# Patient Record
Sex: Female | Born: 1950
Health system: Southern US, Community
[De-identification: ages and names within clinical notes are randomized; demographics above are authoritative.]

## PROBLEM LIST (undated history)

## (undated) DIAGNOSIS — C801 Malignant (primary) neoplasm, unspecified: Secondary | ICD-10-CM

## (undated) DIAGNOSIS — K635 Polyp of colon: Secondary | ICD-10-CM

## (undated) DIAGNOSIS — M810 Age-related osteoporosis without current pathological fracture: Secondary | ICD-10-CM

## (undated) HISTORY — PX: POLYPECTOMY: SHX149

## (undated) HISTORY — DX: Age-related osteoporosis without current pathological fracture: M81.0

## (undated) HISTORY — DX: Malignant (primary) neoplasm, unspecified: C80.1

## (undated) HISTORY — DX: Polyp of colon: K63.5

## (undated) HISTORY — PX: COLONOSCOPY: SHX174

---

## 1999-07-11 ENCOUNTER — Encounter (INDEPENDENT_AMBULATORY_CARE_PROVIDER_SITE_OTHER): Payer: Self-pay | Admitting: Specialist

## 1999-07-11 ENCOUNTER — Other Ambulatory Visit: Admission: RE | Admit: 1999-07-11 | Discharge: 1999-07-11 | Payer: Self-pay | Admitting: Obstetrics and Gynecology

## 2005-06-28 ENCOUNTER — Ambulatory Visit: Payer: Self-pay | Admitting: Internal Medicine

## 2006-07-09 ENCOUNTER — Ambulatory Visit: Payer: Self-pay | Admitting: Internal Medicine

## 2006-07-18 ENCOUNTER — Ambulatory Visit: Payer: Self-pay | Admitting: Internal Medicine

## 2006-08-07 ENCOUNTER — Encounter (INDEPENDENT_AMBULATORY_CARE_PROVIDER_SITE_OTHER): Payer: Self-pay | Admitting: Specialist

## 2006-08-07 ENCOUNTER — Ambulatory Visit: Payer: Self-pay | Admitting: Internal Medicine

## 2008-05-06 ENCOUNTER — Ambulatory Visit: Payer: Self-pay | Admitting: Internal Medicine

## 2008-05-06 DIAGNOSIS — E7800 Pure hypercholesterolemia, unspecified: Secondary | ICD-10-CM | POA: Insufficient documentation

## 2008-05-06 DIAGNOSIS — M1990 Unspecified osteoarthritis, unspecified site: Secondary | ICD-10-CM | POA: Insufficient documentation

## 2009-06-13 ENCOUNTER — Encounter (INDEPENDENT_AMBULATORY_CARE_PROVIDER_SITE_OTHER): Payer: Self-pay | Admitting: *Deleted

## 2010-01-19 ENCOUNTER — Telehealth (INDEPENDENT_AMBULATORY_CARE_PROVIDER_SITE_OTHER): Payer: Self-pay | Admitting: *Deleted

## 2010-05-07 ENCOUNTER — Encounter (INDEPENDENT_AMBULATORY_CARE_PROVIDER_SITE_OTHER): Payer: Self-pay | Admitting: *Deleted

## 2010-05-09 ENCOUNTER — Encounter: Payer: Self-pay | Admitting: Internal Medicine

## 2010-05-28 ENCOUNTER — Encounter (INDEPENDENT_AMBULATORY_CARE_PROVIDER_SITE_OTHER): Payer: Self-pay | Admitting: *Deleted

## 2010-06-01 ENCOUNTER — Ambulatory Visit: Payer: Self-pay | Admitting: Internal Medicine

## 2010-06-14 ENCOUNTER — Ambulatory Visit: Payer: Self-pay | Admitting: Internal Medicine

## 2010-08-22 ENCOUNTER — Ambulatory Visit: Payer: Self-pay | Admitting: Internal Medicine

## 2010-08-22 ENCOUNTER — Encounter: Payer: Self-pay | Admitting: Internal Medicine

## 2010-08-22 LAB — CONVERTED CEMR LAB
ALT: 13 units/L (ref 0–35)
AST: 23 units/L (ref 0–37)
Alkaline Phosphatase: 59 units/L (ref 39–117)
Basophils Relative: 1.2 % (ref 0.0–3.0)
Bilirubin Urine: NEGATIVE
Bilirubin, Direct: 0.1 mg/dL (ref 0.0–0.3)
Calcium: 9.2 mg/dL (ref 8.4–10.5)
Cholesterol: 208 mg/dL — ABNORMAL HIGH (ref 0–200)
Creatinine, Ser: 0.8 mg/dL (ref 0.4–1.2)
Eosinophils Absolute: 0.1 10*3/uL (ref 0.0–0.7)
Eosinophils Relative: 2.5 % (ref 0.0–5.0)
GFR calc non Af Amer: 75.36 mL/min (ref >60.00)
Hemoglobin: 12.6 g/dL (ref 12.0–15.0)
Lymphocytes Relative: 36.2 % (ref 12.0–46.0)
Monocytes Relative: 7.8 % (ref 3.0–12.0)
Neutro Abs: 2 10*3/uL (ref 1.4–7.7)
Neutrophils Relative %: 52.3 % (ref 43.0–77.0)
Nitrite: NEGATIVE
RBC: 3.76 M/uL — ABNORMAL LOW (ref 3.87–5.11)
Sodium: 143 meq/L (ref 135–145)
Total CHOL/HDL Ratio: 3
Total Protein, Urine: NEGATIVE mg/dL
Total Protein: 6.5 g/dL (ref 6.0–8.3)
Triglycerides: 37 mg/dL (ref 0.0–149.0)
Urine Glucose: NEGATIVE mg/dL
VLDL: 7.4 mg/dL (ref 0.0–40.0)
WBC: 3.8 10*3/uL — ABNORMAL LOW (ref 4.5–10.5)
pH: 6 (ref 5.0–8.0)

## 2010-09-13 ENCOUNTER — Telehealth (INDEPENDENT_AMBULATORY_CARE_PROVIDER_SITE_OTHER): Payer: Self-pay | Admitting: *Deleted

## 2010-09-18 ENCOUNTER — Telehealth (INDEPENDENT_AMBULATORY_CARE_PROVIDER_SITE_OTHER): Payer: Self-pay | Admitting: *Deleted

## 2010-10-07 LAB — CONVERTED CEMR LAB
ALT: 15 units/L (ref 0–35)
Albumin: 4.2 g/dL (ref 3.5–5.2)
Alkaline Phosphatase: 46 units/L (ref 39–117)
BUN: 12 mg/dL (ref 6–23)
Bilirubin, Direct: 0.2 mg/dL (ref 0.0–0.3)
CO2: 29 meq/L (ref 19–32)
CRP, High Sensitivity: <1 — ABNORMAL LOW (ref 0.00–5.00)
Calcium: 9.3 mg/dL (ref 8.4–10.5)
Eosinophils Relative: 2.3 % (ref 0.0–5.0)
GFR calc Af Amer: 73 mL/min
Glucose, Bld: 93 mg/dL (ref 70–99)
HCT: 40.8 % (ref 36.0–46.0)
Hemoglobin: 14 g/dL (ref 12.0–15.0)
Lymphocytes Relative: 34.5 % (ref 12.0–46.0)
Monocytes Absolute: 0.3 10*3/uL (ref 0.1–1.0)
Monocytes Relative: 6.3 % (ref 3.0–12.0)
Neutro Abs: 3.1 10*3/uL (ref 1.4–7.7)
RBC: 4.24 M/uL (ref 3.87–5.11)
Sodium: 143 meq/L (ref 135–145)
TSH: 2.8 microintl units/mL (ref 0.35–5.50)
Total CHOL/HDL Ratio: 2.6
Total Protein: 7.2 g/dL (ref 6.0–8.3)
VLDL: 5 mg/dL (ref 0–40)
WBC: 5.5 10*3/uL (ref 4.5–10.5)

## 2010-10-11 NOTE — Miscellaneous (Signed)
Summary: Lec previsit  Clinical Lists Changes  Medications: Added new medication of MOVIPREP 100 GM  SOLR (PEG-KCL-NACL-NASULF-NA ASC-C) As per prep instructions. - Signed Rx of MOVIPREP 100 GM  SOLR (PEG-KCL-NACL-NASULF-NA ASC-C) As per prep instructions.;  #1 x 0;  Signed;  Entered by: Karl Bales RN;  Authorized by: Iva Boop MD, FACG;  Method used: Electronically to [facility] [address], [place], [place]  [address], Ph: [phone] or [phone], Fax: [phone] Observations: Added new observation of NKA: T (06/01/2010 10:17)    Prescriptions: MOVIPREP 100 GM  SOLR (PEG-KCL-NACL-NASULF-NA ASC-C) As per prep instructions.  #1 x 0   Entered by:   Karl Bales RN   Authorized by:   Iva Boop MD, [facility]   Signed by:   Karl Bales RN on 06/01/2010   Method used:   Electronically to        [facility] (retail)       [address]       [place], [place]  [address]       Ph: [phone] or [phone]       Fax: [phone]   RxID:   [id number]

## 2010-10-11 NOTE — Procedures (Signed)
Summary: Colonoscopy  Patient: Catherine [name] Note: All result statuses are Final unless otherwise noted.  Tests: (1) Colonoscopy (COL)   COL Colonoscopy           DONE     [facility]     [address]     [place], [place]  [address]           COLONOSCOPY PROCEDURE REPORT           PATIENT:  Catherine [name], Catherine [name]  MR#:  [id number]     BIRTHDATE:  [birth year], 59 yrs. old  GENDER:  female     ENDOSCOPIST:  Iva Boop, MD, [facility]           PROCEDURE DATE:  06/14/2010     PROCEDURE:  Colonoscopy [address]     ASA CLASS:  Class I     INDICATIONS:  surveillance and high-risk screening, history of     pre-cancerous (adenomatous) colon polyps 7 mm tubulovillous     adenoma removed 08/07/06     MEDICATIONS:   Fentanyl 50 mcg IV, Versed 6 mg IV           DESCRIPTION OF PROCEDURE:   After the risks benefits and     alternatives of the procedure were thoroughly explained, informed     consent was obtained.  Digital rectal exam was performed and     revealed no abnormalities.   The LB [id number]-[id number] endoscope     was introduced through the anus and advanced to the cecum, which     was identified by both the appendix and ileocecal valve, without     limitations.  The quality of the prep was excellent, using     MoviPrep.  The instrument was then slowly withdrawn as the colon     was fully examined. Insertion: 6:33 minutes withdrawal: 7:17     minutes     <<PROCEDUREIMAGES>>           FINDINGS:  A normal appearing cecum, ileocecal valve, and     appendiceal orifice were identified. The ascending, hepatic     flexure, transverse, splenic flexure, descending, sigmoid colon,     and rectum appeared unremarkable.   Retroflexed views in the     rectum revealed no abnormalities.    The scope was then withdrawn     from the patient and the procedure completed.           COMPLICATIONS:  None     ENDOSCOPIC IMPRESSION:     1) Normal colonoscopy     2) prior tubulovillous adenoma (7mm)  removal 2007           REPEAT EXAM:  In 5 year(s) for routine screening colonoscopy.           Iva Boop, MD, [facility]           CC:  The Patient           n.     eSIGNED:   Iva Boop at 06/14/2010 02:32 PM           Miles Costain, [phone]  Note: An exclamation mark (!) indicates a result that was not dispersed into the flowsheet. Document Creation Date: 06/14/2010 2:33 PM _______________________________________________________________________  (1) Order result status: Final Collection or observation date-time: 06/14/2010 14:23 Requested date-time:  Receipt date-time:  Reported date-time:  Referring Physician:   Ordering Physician: Stan Head [phone]) Specimen Source:  Source: Launa Grill Order Number: [id number] Lab  site:   Appended Document: Colonoscopy    Clinical Lists Changes  Observations: Added new observation of COLONNXTDUE: 06/2015 (06/14/2010 15:17)

## 2010-10-11 NOTE — Progress Notes (Signed)
Summary: Schedule recall colonoscopy  Phone Note Outgoing Call Call back at Home Phone [phone]   Call placed by: Christie Nottingham CMA Duncan Dull),  Jan 19, 2010 10:32 AM Call placed to: Patient Summary of Call: Called pt to schedule recall colonoscopy and pt states she would like to look at her calender and call us back to schedule the procedure. She states she was in the process of moving last year and now has gotten everything settled and is now ready to schedule. She will call us back. Initial call taken by: Christie Nottingham CMA Duncan Dull),  Jan 19, 2010 10:33 AM

## 2010-10-11 NOTE — Letter (Signed)
Summary: Previsit letter  [facility]  [address] [place], [place] [address]   Phone: [phone]  Fax: [phone]       05/07/2010 MRN: [id number]  [facility] [address] [place], [place]  [address]  Dear Ms. Bogacki,  Welcome to the Gastroenterology Division at [facility].    You are scheduled to see a nurse for your pre-procedure visit on 05/29/2010 at 8:30AM on the 3rd floor at [facility], [address]  We ask that you try to arrive at our office 15 minutes prior to your appointment time to allow for check-in.  Your nurse visit will consist of discussing your medical and surgical history, your immediate family medical history, and your medications.    Please bring a complete list of all your medications or, if you prefer, bring the medication bottles and we will list them.  We will need to be aware of both prescribed and over the counter drugs.  We will need to know exact dosage information as well.  If you are on blood thinners (Coumadin, Plavix, Aggrenox, Ticlid, etc.) please call our office today/prior to your appointment, as we need to consult with your physician about holding your medication.   Please be prepared to read and sign documents such as consent forms, a financial agreement, and acknowledgement forms.  If necessary, and with your consent, a friend or relative is welcome to sit-in on the nurse visit with you.  Please bring your insurance card so that we may make a copy of it.  If your insurance requires a referral to see a specialist, please bring your referral form from your primary care physician.  No co-pay is required for this nurse visit.     If you cannot keep your appointment, please call [phone] to cancel or reschedule prior to your appointment date.  This allows [place] the opportunity to schedule an appointment for another patient in need of care.    Thank you for choosing [facility] for your medical needs.   We appreciate the opportunity to care for you.  Please visit [place] at our website  to learn more about our practice.                     Sincerely.                                                                                                                   The Gastroenterology Division

## 2010-10-11 NOTE — Progress Notes (Signed)
Summary: lab results  Phone Note Call from Patient Call back at Home Phone [phone]   Caller: Patient Call For: wert Reason for Call: Talk to Nurse, Lab or Test Results Summary of Call: Patient requesting lab results. Initial call taken by: Lehman Prom,  September 13, 2010 12:45 PM  Follow-up for Phone Call        Spoke with pt and notified vitamin d normal no changes in therapy needed. Follow-up by: Vernie Murders,  September 13, 2010 3:39 PM

## 2010-10-11 NOTE — Assessment & Plan Note (Signed)
Summary: Primary svc/ cpx    Primary Provider/Referring Provider:  Sherene Sires   History of Present Illness: 60  yowf never smoker   osteopenia dr Elana Alm plus vit d mamos and bone scans and pap smear  August 22, 2010 cpx no complaints. Pt denies any significant sore throat, dysphagia, itching, sneezing,  nasal congestion or excess secretions,  fever, chills, sweats, unintended wt loss, pleuritic or exertional cp, hempoptysis, change in activity tolerance  orthopnea pnd or leg swelling   Current Medications (verified): 1)  Actonel 35 Mg Tabs (Risedronate Sodium) .... Once Weekly 2)  Caltrate 600 1500 Mg Tabs (Calcium Carbonate) .Marland Kitchen.. 1 Two Times A Day 3)  D 1000 Plus  Tabs (Fa-Cyanocobalamin-B6-D-Ca) .... Once Daily 4)  Fish Oil 1000 Mg Caps (Omega-3 Fatty Acids) .Marland Kitchen.. 1 Once Daily 5)  Multivitamins  Tabs (Multiple Vitamin) .Marland Kitchen.. 1 Once Daily  Allergies (verified): No Known Drug Allergies  Past History:  Past Medical History: Osteoporosis   - fu McPhail,  restart weekly actonel August 22, 2010  Health Maint   - Td 2/06   - CPX  August 22, 2010  Colon Polyps   - colonoscopy 08/07/06 > tubulovillous   - Repeat colonscopy 10/ 6/11 neg  Family History: Hypothryoid mother Lung cancer father, smoker  siblings all younger  Social History: never smoker ETOH 2-3 x per wk Retired  Vital Signs:  Patient profile:   60 year old female Height:      67 inches Weight:      134.50 pounds BMI:     21.14 O2 Sat:      98 % on Room air Temp:     98.0 degrees F oral Pulse rate:   58 / minute BP sitting:   94 / 60  (left arm)  Vitals Entered By: Vernie Murders (August 22, 2010 8:47 AM)  O2 Flow:  Room air  Physical Exam  Additional Exam:  Ambulatory healthy appearing rel thin wf in no acute distress. wt 131 May 06 2008 > 134 August 22, 2010  HEENT: nl dentition, turbinates, and orophanx. Nl external ear canals without cough reflex Neck without JVD/Nodes/TM Lungs clear to A  and P bilaterally without cough on insp or exp maneuvers RRR no s3 or murmur or increase in P2 Abd soft and benign with nl excursion in the supine position. No bruits or organomegaly Ext warm without calf tenderness, cyanosis clubbing or edema Skin warm and dry without lesions   Neuro alert, approp, nl sensorium, no motor def.      Cholesterol          [H]  208 mg/dL                   6-301     ATP III Classification            Desirable:  < 200 mg/dL                    Borderline High:  200 - 239 mg/dL               High:  > = 240 mg/dL   Triglycerides             37.0 mg/dL                  6.0-109.3     Normal:  <150 mg/dL     Borderline High:  235 - 199 mg/dL   HDL  72.80 mg/dL                 >19.14   VLDL Cholesterol          7.4 mg/dL                   7.8-29.5  CHO/HDL Ratio:  CHD Risk                             3                    Men          Women     1/2 Average Risk     3.4          3.3     Average Risk          5.0          4.4     2X Average Risk          9.6          7.1     3X Average Risk          15.0          11.0                           Tests: (2) BMP (METABOL)   Sodium                    143 mEq/L                   135-145   Potassium                 3.9 mEq/L                   3.5-5.1   Chloride                  107 mEq/L                   96-112   Carbon Dioxide            29 mEq/L                    19-32   Glucose                   77 mg/dL                    62-13   BUN                       15 mg/dL                    0-86   Creatinine                0.8 mg/dL                   5.7-8.4   Calcium                   9.2 mg/dL                   6.9-62.9   GFR  75.36 mL/min                >60.00  Tests: (3) CBC Platelet w/Diff (CBCD)   White Cell Count     [L]  3.8 K/uL                    4.5-10.5   Red Cell Count       [L]  3.76 Mil/uL                 3.87-5.11   Hemoglobin                12.6 g/dL                    19.1-47.8   Hematocrit                36.8 %                      36.0-46.0   MCV                       97.7 fl                     78.0-100.0   MCHC                      34.4 g/dL                   29.5-62.1   RDW                       14.0 %                      11.5-14.6   Platelet Count            269.0 K/uL                  150.0-400.0   Neutrophil %              52.3 %                      43.0-77.0   Lymphocyte %              36.2 %                      12.0-46.0   Monocyte %                7.8 %                       3.0-12.0   Eosinophils%              2.5 %                       0.0-5.0   Basophils %               1.2 %                       0.0-3.0   Neutrophill Absolute      2.0 K/uL                    1.4-7.7   Lymphocyte Absolute  1.4 K/uL                    0.7-4.0   Monocyte Absolute         0.3 K/uL                    0.1-1.0  Eosinophils, Absolute                             0.1 K/uL                    0.0-0.7   Basophils Absolute        0.0 K/uL                    0.0-0.1  Tests: (4) Hepatic/Liver Function Panel (HEPATIC)   Total Bilirubin           0.6 mg/dL                   1.6-1.0   Direct Bilirubin          0.1 mg/dL                   9.6-0.4   Alkaline Phosphatase      59 U/L                      39-117   AST                       23 U/L                      0-37   ALT                       13 U/L                      0-35   Total Protein             6.5 g/dL                    5.4-0.9   Albumin                   3.9 g/dL                    8.1-1.9  Tests: (5) TSH (TSH)   FastTSH                   2.27 uIU/mL                 0.35-5.50  Tests: (6) B12 Serum - Total ONLY (B12)   Vitamin B12               638 pg/mL                   211-911  Tests: (7) UDip w/Micro (URINE)   Color                     LT. YELLOW       RANGE:  Yellow;Lt. Yellow   Clarity                   Sl Cloudy  Clear   Specific Gravity          1.020                        1.000 - 1.030   Urine Ph                  6.0                         5.0-8.0   Protein                   NEGATIVE                    Negative   Urine Glucose             NEGATIVE                    Negative   Ketones                   TRACE                       Negative   Urine Bilirubin           NEGATIVE                    Negative   Blood                     TRACE-INTACT                Negative   Urobilinogen              0.2                         0.0 - 1.0   Leukocyte Esterace        MODERATE                    Negative   Nitrite                   NEGATIVE                    Negative   Urine WBC                 11-20/hpf                   0-2/hpf   Urine RBC                 0-2/hpf                     0-2/hpf   Urine Epith               Few(5-10/hpf)               Rare(0-4/hpf)   Renal Epithelial          Rare(0-4/hpf)               None   Urine Bacteria            Few(10-50/hpf)              None  Tests: (8) Cholesterol LDL - Direct (DIRLDL)  Cholesterol LDL - Direct  107.4 mg/dL    Impression & Recommendations:  Problem # 1:  HYPERCHOLESTEROLEMIA (ICD-272.0)  Labs Reviewed: SGOT: 24 (05/06/2008)   SGPT: 15 (05/06/2008)   HDL:79.1 (05/06/2008)  LDL:DEL (05/06/2008)  Chol:207 (05/06/2008)  Trig:27 (05/06/2008)  LDL 107 August 22, 2010   Problem # 2:  OSTEOPOROSIS (ICD-733.00) Vit D ok, f/u McPhail Calcium/ ex reviewed  Medications Added to Medication List This Visit: 1)  Caltrate 600 1500 Mg Tabs (Calcium carbonate) .Marland Kitchen.. 1 two times a day 2)  Fish Oil 1000 Mg Caps (Omega-3 fatty acids) .Marland Kitchen.. 1 once daily 3)  Multivitamins Tabs (Multiple vitamin) .Marland Kitchen.. 1 once daily  Other Orders: EKG w/ Interpretation ([address]) T-Vitamin D (25-Hydroxy) ([address]) Est. Patient 40-64 years ([address]) TLB-Lipid Panel (80061-LIPID) TLB-BMP (Basic Metabolic Panel-BMET) (80048-METABOL) TLB-CBC Platelet - w/Differential  (85025-CBCD) TLB-Hepatic/Liver Function Pnl (80076-HEPATIC) TLB-TSH (Thyroid Stimulating Hormone) (84443-TSH) TLB-Udip ONLY (81003-UDIP) TLB-B12, Serum-Total ONLY (91478-G95)  Patient Instructions: 1)  Call [phone] for your results w/in next 3 days - if there's something important  I feel you need to know,  I'll be in touch with you directly.

## 2010-10-11 NOTE — Progress Notes (Signed)
Summary: rx request  Phone Note Call from Patient Call back at Home Phone [phone]   Caller: Patient Call For: wert Summary of Call: pt is going on vacation soon and requests a rx for ciproflaxin 500mg  "just in case she gets traveler's diarrhea". she says dr wert has given her a rx for this before. [facility]  Initial call taken by: Tivis Ringer, CNA,  September 18, 2010 10:03 AM  Follow-up for Phone Call        Spoke with pt and made aware MW out of the office until 1/12 bu8t will go ahead and forward msg.  Per pt, this is fine as she is not leaving until 1/20.  Pls advise if okay to call in cipro for pt, thanks Follow-up by: Vernie Murders,  September 18, 2010 10:17 AM  Additional Follow-up for Phone Call Additional follow up Details #1::        ok rx 500 mg two times a day x 5 days Additional Follow-up by: Nyoka Cowden MD,  September 18, 2010 2:46 PM    Additional Follow-up for Phone Call Additional follow up Details #2::    Rx was sent to pharm.  LMOM for pt to be made aware this was done. Follow-up by: Vernie Murders,  September 18, 2010 2:55 PM  New/Updated Medications: CIPROFLOXACIN HCL 500 MG TABS (CIPROFLOXACIN HCL) 1 two times a day until gone Prescriptions: CIPROFLOXACIN HCL 500 MG TABS (CIPROFLOXACIN HCL) 1 two times a day until gone  #10 x 0   Entered by:   Vernie Murders   Authorized by:   Nyoka Cowden MD   Signed by:   Vernie Murders on 09/18/2010   Method used:   Electronically to        [facility] [phone]* (retail)       [address]       [place], [place]  [address]       Ph: [phone]       Fax: [phone]   RxID:   [id number]

## 2010-10-11 NOTE — Letter (Signed)
Summary: [facility] Instructions  [facility]  [address] [place], [place] [address]   Phone: [phone]  Fax: [phone]       Catherine [name]    [birth year]    MRN: [id number]        Procedure Day /Date: Thursday 06-14-10     Arrival Time: 1:00 p.m.     Procedure Time: 2:00 p.m.     Location of Procedure:                    _x_  [facility] (4th Floor)  PREPARATION FOR COLONOSCOPY WITH MOVIPREP   Starting 5 days prior to your procedure  06-09-10 do not eat nuts, seeds, popcorn, corn, beans, peas,  salads, or any raw vegetables.  Do not take any fiber supplements (e.g. Metamucil, Citrucel, and Benefiber).  THE DAY BEFORE YOUR PROCEDURE         DATE:  06-13-10   DAY: Wednesday  1.  Drink clear liquids the entire day-NO SOLID FOOD  2.  Do not drink anything colored red or purple.  Avoid juices with pulp.  No orange juice.  3.  Drink at least 64 oz. (8 glasses) of fluid/clear liquids during the day to prevent dehydration and help the prep work efficiently.  CLEAR LIQUIDS INCLUDE: Water Jello Ice Popsicles Tea (sugar ok, no milk/cream) Powdered fruit flavored drinks Coffee (sugar ok, no milk/cream) Gatorade Juice: apple, white grape, white cranberry  Lemonade Clear bullion, consomm, broth Carbonated beverages (any kind) Strained chicken noodle soup Hard Candy                             4.  In the morning, mix first dose of MoviPrep solution:    Empty 1 Pouch A and 1 Pouch B into the disposable container    Add lukewarm drinking water to the top line of the container. Mix to dissolve    Refrigerate (mixed solution should be used within 24 hrs)  5.  Begin drinking the prep at 5:00 p.m. The MoviPrep container is divided by 4 marks.   Every 15 minutes drink the solution down to the next mark (approximately 8 oz) until the full liter is complete.   6.  Follow completed prep with 16 oz of clear liquid of your choice (Nothing red or purple).   Continue to drink clear liquids until bedtime.  7.  Before going to bed, mix second dose of MoviPrep solution:    Empty 1 Pouch A and 1 Pouch B into the disposable container    Add lukewarm drinking water to the top line of the container. Mix to dissolve    Refrigerate  THE DAY OF YOUR PROCEDURE      DATE: 06-14-10  DAY: Thursday  Beginning at  9:00 a.m. (5 hours before procedure):         1. Every 15 minutes, drink the solution down to the next mark (approx 8 oz) until the full liter is complete.  2. Follow completed prep with 16 oz. of clear liquid of your choice.    3. You may drink clear liquids until  12:00 p.m. (2 HOURS BEFORE PROCEDURE).   MEDICATION INSTRUCTIONS  Unless otherwise instructed, you should take regular prescription medications with a small sip of water   as early as possible the morning of your procedure.        OTHER INSTRUCTIONS  You will need a responsible  adult at least 61 years of age to accompany you and drive you home.   This person must remain in the waiting room during your procedure.  Wear loose fitting clothing that is easily removed.  Leave jewelry and other valuables at home.  However, you may wish to bring a book to read or  an iPod/MP3 player to listen to music as you wait for your procedure to start.  Remove all body piercing jewelry and leave at home.  Total time from sign-in until discharge is approximately 2-3 hours.  You should go home directly after your procedure and rest.  You can resume normal activities the  day after your procedure.  The day of your procedure you should not:   Drive   Make legal decisions   Operate machinery   Drink alcohol   Return to work  You will receive specific instructions about eating, activities and medications before you leave.    The above instructions have been reviewed and explained to me by   Karl Bales RN  June 01, 2010 10:54 AM    I fully understand and can  verbalize these instructions _____________________________ Date _________

## 2011-01-01 ENCOUNTER — Telehealth: Payer: Self-pay | Admitting: Internal Medicine

## 2011-01-01 ENCOUNTER — Ambulatory Visit (INDEPENDENT_AMBULATORY_CARE_PROVIDER_SITE_OTHER): Payer: BC Managed Care – PPO | Admitting: Internal Medicine

## 2011-01-01 ENCOUNTER — Encounter: Payer: Self-pay | Admitting: Internal Medicine

## 2011-01-01 DIAGNOSIS — E7800 Pure hypercholesterolemia, unspecified: Secondary | ICD-10-CM

## 2011-01-01 DIAGNOSIS — R05 Cough: Secondary | ICD-10-CM

## 2011-01-01 DIAGNOSIS — M810 Age-related osteoporosis without current pathological fracture: Secondary | ICD-10-CM

## 2011-01-01 DIAGNOSIS — R059 Cough, unspecified: Secondary | ICD-10-CM | POA: Insufficient documentation

## 2011-01-01 MED ORDER — TRAMADOL HCL 50 MG PO TABS: ORAL_TABLET | ORAL | Status: DC

## 2011-01-01 NOTE — Telephone Encounter (Signed)
Pt scheduled to be seen today by MW at 2:30 pm. Pt aware.

## 2011-01-01 NOTE — Progress Notes (Signed)
  Subjective:    Patient ID: Catherine [name], female    DOB: 1951-04-17, 60 y.o.   MRN: [id number]  HPI  49 yowf never smoker followed by Catherine [name] for primary care osteopenia dr Catherine [name]   01/01/2011 ov/ Catherine [name] acute onset st/ cough with discolored sputum x 10 days better x for nagging cough and sensation of pnds worse day than night assoc with hoarseness. Pt denies any significant  , dysphagia, itching, sneezing,  nasal congestion or excess/ purulent secretions,  fever, chills, sweats, unintended wt loss, pleuritic or exertional cp, hempoptysis, orthopnea pnd or leg swelling.    Also denies any obvious fluctuation of symptoms with weather or environmental changes or other aggravating or alleviating factors.         Past Medical Hx Osteoporosis  - fu Catherine [name], restart weekly actonel August 22, 2010  Health Maintenance ...........  Catherine [name] - Td 2/06  - CPX August 22, 2010  Colon Polyps  - colonoscopy 08/07/06 > tubulovillous  - Repeat colonscopy 10/ 6/11 neg    Family History:  Hypothryoid mother  Lung cancer father, smoker  siblings all younger   Social History:  never smoker  ETOH 2-3 x per wk  Retired             Review of Systems     Objective:   Physical Exam     Ambulatory healthy appearing rel thin wf in no acute distress.  wt 131 May 06 2008 > 134 August 22, 2010 > 138 01/01/2011  HEENT: nl dentition, turbinates, and orophanx. Nl external ear canals without cough reflex  Neck without JVD/Nodes/TM  Lungs clear to A and P bilaterally without cough on insp or exp maneuvers  RRR no s3 or murmur or increase in P2  Abd soft and benign with nl excursion in the supine position. No bruits or organomegaly  Ext warm without calf tenderness, cyanosis clubbing or edema  Skin warm and dry without lesions    Assessment & Plan:

## 2011-01-01 NOTE — Patient Instructions (Addendum)
Take delsym two tsp every 12 hours and supplement if needed with  tramadol 50 mg up to 2 every 4 hours to suppress the urge to cough. Swallowing water or using ice chips/non mint and menthol containing candies (such as lifesavers or sugarless jolly ranchers) are also effective.  You should rest your voice and avoid activities that you know make you cough.  Once you have eliminated the cough for 3 straight days try reducing the tramadol first,  then the delsym as tolerated.    Prilosec 20 mg Take 30-60 min before first meal of the day and Pepcid 20 mg one at bedtime  GERD (REFLUX)  is an extremely common cause of respiratory symptoms, many times with no significant heartburn at all.    It can be treated with medication, but also with lifestyle changes including avoidance of late meals, excessive alcohol, smoking cessation, and avoid fatty foods, chocolate, peppermint, colas, red wine, and acidic juices such as orange juice.  NO MINT OR MENTHOL PRODUCTS SO NO COUGH DROPS  USE SUGARLESS CANDY INSTEAD (jolley ranchers or Stover's)  NO OIL BASED VITAMINS

## 2011-01-01 NOTE — Assessment & Plan Note (Signed)
Explained natural history of uri and why it's necessary in patients at risk to treat GERD aggressively  at least  short term   to reduce risk of evolving cyclical cough initially  triggered by epithelial injury and a heightened sensitivty to the effects of any upper airway irritants,  most importantly acid - related.  That is, the more sensitive the epithelium damaged for virus, the more the cough, the more the secondary reflux (especially in those prone to reflux) the more the irritation of the sensitive mucosa and so on in a cyclical pattern.  See instructions for specific recommendations which were reviewed directly with the patient who was given a copy with highlighter outlining the key components.  

## 2011-01-25 NOTE — Assessment & Plan Note (Signed)
[facility]                               PULMONARY OFFICE NOTE   Catherine [name], Catherine [name]                         MRN:          [id number]  DATE:07/09/2006                            DOB:          [birth year]    A 60 year old white female who carries a diagnosis of osteoporosis, been on  Actonel since 2003.  Comes in for a comprehensive health care evaluation  with a history of mild hyperlipidemia but no exertional chest pain,  orthopnea, TIA or claudication symptoms.  She gets regular exercise, though  not necessarily aerobic and not necessarily weight bearing.  However, she is  quite active.   PAST MEDICAL HISTORY:  Is significant for osteoporosis __________.   Allergies none known.   MEDICATIONS:  1. Vagifem three times weekly.  2. Actonel 35 mg one weekly.  3. Multivitamin 1 daily.  4. Caltrate 1200 mg daily.   SOCIAL HISTORY:  She has never smoked, she denies any alcohol use.   FAMILY HISTORY:  Positive for thyroid deficiency in her mother.  Father died  at age 22 of lung cancer but was a smoker.  One brother had tuberculosis but  she was not in contact with him.  She is the oldest of 3 siblings, one of  her sister had benign breast cyst.   REVIEW OF SYSTEMS:  Taken in detail on work sheet.  Significant for problems  outlined above.   She is concerned about moles.   PHYSICAL EXAMINATION:  This is a pleasant ambulatory thin white female in no  acute distress.  VITAL SIGNS:  Stable.  Height is 5 feet 7 inches which is no change from  baseline.  Blood pressure is 110/64.  HEENT:  Oropharynx is clear.  Dentition is intact.  Limited funduscopy  revealed no significant retinal arterial change.  NECK:  Was supple without cervical adenopathy or tenderness. Trachea is  midline, no thyromegaly.  LUNGS:  Fields clear bilaterally to auscultation and percussion.  There is regular rate and rhythm without murmur, rub, gallops or PMI.  ABDOMEN:  Soft,  benign with no palpable organomegaly, masses or tenderness.  EXTREMITIES:  Warm without calf tenderness, cyanosis, clubbing or edema.  Pedal pulses were present bilaterally.  NEUROLOGIC:  No focal deficits.  SKIN:  Was warm and dry.   Chemistry profile was normal.  CBC was normal. Total cholesterol was 211  with an HDL of 64 but with an LDL of 125.  TSH was normal.  Urinalysis was  unremarkable.   EKG was normal.   IMPRESSION:  1. Osteoporosis, followed by Dr. Elana Alm on Actonel once weekly and      tolerating it well to date.  2. Hyperlipidemia.  She actually has a very favorable lipid profile with a      relatively high HDL and relatively low LDL.  I have congratulated her      on this by phone. I recommended a followup at age 55, unless she has      specific problems that require medical attention.  3. Health maintenance. She was given tetanus  in 2006.  She does need a      colon screening per our records.    ______________________________  Charlaine Dalton Catherine Sires, MD, [facility]    MBW/MedQ  DD: 07/10/2006  DT: 07/11/2006  Job #: [id number]   cc:   S. Kyra Manges, M.D.

## 2011-08-22 ENCOUNTER — Encounter: Payer: Self-pay | Admitting: Internal Medicine

## 2011-08-23 ENCOUNTER — Encounter: Payer: Self-pay | Admitting: Gynecology

## 2011-08-23 ENCOUNTER — Telehealth: Payer: Self-pay | Admitting: *Deleted

## 2011-08-23 ENCOUNTER — Other Ambulatory Visit (HOSPITAL_COMMUNITY)
Admission: RE | Admit: 2011-08-23 | Discharge: 2011-08-23 | Disposition: A | Discharge status: Home / Self Care | Payer: BC Managed Care – PPO | Source: Ambulatory Visit | Attending: Gynecology | Admitting: Gynecology

## 2011-08-23 ENCOUNTER — Ambulatory Visit (INDEPENDENT_AMBULATORY_CARE_PROVIDER_SITE_OTHER): Payer: BC Managed Care – PPO | Admitting: Gynecology

## 2011-08-23 VITALS — BP 106/68 | Ht 67.25 in | Wt 140.0 lb

## 2011-08-23 DIAGNOSIS — M899 Disorder of bone, unspecified: Secondary | ICD-10-CM

## 2011-08-23 DIAGNOSIS — Z01419 Encounter for gynecological examination (general) (routine) without abnormal findings: Secondary | ICD-10-CM

## 2011-08-23 DIAGNOSIS — M8580 Other specified disorders of bone density and structure, unspecified site: Secondary | ICD-10-CM

## 2011-08-23 DIAGNOSIS — R82998 Other abnormal findings in urine: Secondary | ICD-10-CM

## 2011-08-23 NOTE — Patient Instructions (Signed)
Follow up in one year for annual exam. Will repeat bone density next year and then discuss continued use of Atelvia.

## 2011-08-23 NOTE — Progress Notes (Signed)
Catherine [name] 27-Oct-1950 [id number]        60 y.o.  for annual exam.  Former patient of Dr. August Saucer MacPhail's overall doing well. She is on Atelvia for osteopenia with her last bone density November 2011 showing T score of -2.4. She has been on a bisphosphonate first Actonel starting in 2002.  Past medical history,surgical history, medications, allergies, family history and social history were all reviewed and documented in the EPIC chart. ROS:  Was performed and pertinent positives and negatives are included in the history.  Exam: chaperone present Filed Vitals:   08/23/11 1151  BP: 106/68   General appearance  Normal Skin grossly normal Head/Neck normal with no cervical or supraclavicular adenopathy thyroid normal Lungs  clear Cardiac RR, without RMG Abdominal  soft, nontender, without masses, organomegaly or hernia Breasts  examined lying and sitting without masses, retractions, discharge or axillary adenopathy.  Left nipple dimpled. Pelvic  Ext/BUS/vagina  normal with mild atrophic changes  Cervix  normal  Pap done  Uterus  anteverted, normal size, shape and contour, midline and mobile nontender   Adnexa  Without masses or tenderness    Anus and perineum  normal   Rectovaginal  normal sphincter tone without palpated masses or tenderness.    Assessment/Plan:  60 y.o. female for annual exam.    1. Osteopenia. Patient has been on bisphosphate since 2002. Her bone density last year did show a 6% decline in the AP spine. She had been transiently off of the bisphosphonates due to dental work but then restarted it. We'll go and check vitamin D level today. Increase calcium vitamin D was reviewed with her. She'll remain on her Atelvia through next year when she has her bone density done which will be a 2 year interval and I will further discuss a possible drug-free holiday. I did discuss the long-term issues with bisphosphate 2 include osteonecrosis of the jaw as well as atypical fractures, she  understands and accepts these risks. 2. Postmenopausal. Patient is doing well without significant symptoms. 3. Pap smears. Patient had an ASCUS Pap smear 2 years ago, Pap smear last year was normal. I did a Pap smear this year. I discussed less frequent screening intervals according to current guidelines and we'll readdress this next year.  4. Breast health. Patient has dimpled nipple on the left. She says it's been this way for years and has remained unchanged. SBE monthly reviewed. Just had mammography November 2012 will continue with annual mammography. 5. Colonoscopy. Patient had colonoscopy October 2011. She'll continue purveyor recommended interval. 6. Health maintenance. No other blood work besides vitamin D level was done and she has all done through her primary physician's office who she sees routinely. Assuming she continues well from a gynecologic standpoint then she will see me in a year and we'll readdress the osteopenia/bisphosphate issue.  Addendum: After patient left I realized that she was on Vagifem and we did not discuss this I called her on the telephone. She has been on Vagifem although she's not sure this really helping and was thinking about stopping it. I reviewed the issue of HRT, systemic versus topical in the issues of absorption. She understands the Vagifem does have some absorption although very low and the issues of exposure to include the WHI study with risks of stroke heart attack DVT as well as breast cancer risk was discussed. The patient plans on stopping Vagifem we'll see how she does off of this. She apparently was started for bladder support and  not for vaginal symptoms. If she notices any adverse symptoms after stopping she will call me and will rediscuss the issue.   Dara Lords MD, 12:50 PM 08/23/2011

## 2011-08-23 NOTE — Telephone Encounter (Signed)
Message copied by Aura Camps on Fri Aug 23, 2011  4:28 PM ------      Message from: Dara Lords      Created: Fri Aug 23, 2011  4:12 PM       Get patient on phone for me please

## 2011-08-23 NOTE — Telephone Encounter (Signed)
Got pt on the phone for TF.

## 2011-08-25 ENCOUNTER — Other Ambulatory Visit: Payer: Self-pay | Admitting: Gynecology

## 2011-08-27 ENCOUNTER — Encounter: Payer: Self-pay | Admitting: Gynecology

## 2011-08-27 ENCOUNTER — Telehealth: Payer: Self-pay | Admitting: *Deleted

## 2011-08-27 NOTE — Telephone Encounter (Signed)
Called patient to let her know that we have gotten her insurance approved for her Atelvia through 08-26-12.  She can go ahead and fill rx at pharmacy now.  Also gave patient pap results normal.

## 2011-09-16 ENCOUNTER — Telehealth: Payer: Self-pay | Admitting: Internal Medicine

## 2011-09-16 NOTE — Telephone Encounter (Signed)
Spoke with pt. She states that she and her spouse are going to [place] and she is requesting that we call in transderm scope patches for sea sickness. Pt aware MW out of the office until 09/19/11 Please advise, thanks!

## 2011-09-16 NOTE — Telephone Encounter (Signed)
Encounter closed in error Forwarded back to [facility]

## 2011-09-17 ENCOUNTER — Telehealth: Payer: Self-pay | Admitting: Internal Medicine

## 2011-09-17 ENCOUNTER — Encounter: Payer: Self-pay | Admitting: Gynecology

## 2011-09-17 NOTE — Telephone Encounter (Signed)
Verlon Au accidentally closed previous message. Clydie Braun states they will be gone for 2 weeks and was advised to see if 3 transderm patches could be called in incase things get rough. Please advise Dr. Sherene Sires, thanks

## 2011-09-19 MED ORDER — SCOPOLAMINE 1 MG/3DAYS TD PT72: 1.0000 | MEDICATED_PATCH | TRANSDERMAL | Status: DC

## 2011-09-19 NOTE — Telephone Encounter (Signed)
lmomtcb x1--rx was sent 

## 2011-09-19 NOTE — Telephone Encounter (Signed)
Notified pt that the Rx was sent.  Pt stated nothing further was needed.  Catherine [name]

## 2011-09-19 NOTE — Telephone Encounter (Signed)
Ok to call in scop tansderm # 3 to use one every 3days 

## 2012-06-22 ENCOUNTER — Telehealth: Payer: Self-pay | Admitting: Internal Medicine

## 2012-06-22 NOTE — Telephone Encounter (Signed)
I spoke with pt and she stated she needs her health assessment form by her [employer] insurance to be done by 07/09/12. That is the deadline. She wanted an appt on 07/03/12 since that is a Friday. Pt is scheduled at 9:30 per pt requests. Nothing further was needed

## 2012-07-03 ENCOUNTER — Ambulatory Visit: Payer: BC Managed Care – PPO

## 2012-07-03 ENCOUNTER — Encounter: Payer: Self-pay | Admitting: Internal Medicine

## 2012-07-03 ENCOUNTER — Ambulatory Visit (INDEPENDENT_AMBULATORY_CARE_PROVIDER_SITE_OTHER): Payer: BC Managed Care – PPO | Admitting: Internal Medicine

## 2012-07-03 VITALS — BP 100/70 | HR 72 | Temp 98.1°F | Ht 67.0 in | Wt 137.6 lb

## 2012-07-03 DIAGNOSIS — Z86010 Personal history of colonic polyps: Secondary | ICD-10-CM

## 2012-07-03 DIAGNOSIS — Z23 Encounter for immunization: Secondary | ICD-10-CM

## 2012-07-03 DIAGNOSIS — E7800 Pure hypercholesterolemia, unspecified: Secondary | ICD-10-CM

## 2012-07-03 DIAGNOSIS — Z0000 Encounter for general adult medical examination without abnormal findings: Secondary | ICD-10-CM

## 2012-07-03 LAB — URINALYSIS, ROUTINE W REFLEX MICROSCOPIC
Hgb urine dipstick: NEGATIVE
Specific Gravity, Urine: 1.015 (ref 1.000–1.030)
Urobilinogen, UA: 0.2 (ref 0.0–1.0)

## 2012-07-03 LAB — CBC WITH DIFFERENTIAL/PLATELET
Basophils Relative: 1.1 % (ref 0.0–3.0)
Eosinophils Relative: 3 % (ref 0.0–5.0)
Lymphocytes Relative: 35.1 % (ref 12.0–46.0)
Neutrophils Relative %: 54.1 % (ref 43.0–77.0)
RBC: 4.29 Mil/uL (ref 3.87–5.11)
WBC: 4.3 10*3/uL — ABNORMAL LOW (ref 4.5–10.5)

## 2012-07-03 LAB — HEPATIC FUNCTION PANEL
ALT: 14 U/L (ref 0–35)
Alkaline Phosphatase: 58 U/L (ref 39–117)
Bilirubin, Direct: 0.1 mg/dL (ref 0.0–0.3)
Total Protein: 7.4 g/dL (ref 6.0–8.3)

## 2012-07-03 LAB — LDL CHOLESTEROL, DIRECT: Direct LDL: 148 mg/dL

## 2012-07-03 LAB — BASIC METABOLIC PANEL
Calcium: 9.4 mg/dL (ref 8.4–10.5)
Chloride: 103 mEq/L (ref 96–112)
Creatinine, Ser: 0.8 mg/dL (ref 0.4–1.2)

## 2012-07-03 LAB — LIPID PANEL
Total CHOL/HDL Ratio: 3
Triglycerides: 49 mg/dL (ref 0.0–149.0)

## 2012-07-03 NOTE — Progress Notes (Signed)
  Subjective:    Patient ID: Catherine [name], female    DOB: Sep 28, 1950     MRN: [id number]  HPI  6 yowf never smoker followed by Sherene Sires for primary care osteopenia followed by Dr Audie Box on ca plus vit d mammos and bone scans and pap smears      07/03/2012 f/u ov/Joylene Wescott cc no complaints at all, good activity tol, sleeps fine, no cp, tia or claudication  ROS  The following are not active complaints unless bolded sore throat, dysphagia, dental problems, itching, sneezing,  nasal congestion or excess/ purulent secretions, ear ache,   fever, chills, sweats, unintended wt loss, pleuritic or exertional cp, hemoptysis,  orthopnea pnd or leg swelling, presyncope, palpitations, heartburn, abdominal pain, anorexia, nausea, vomiting, diarrhea  or change in bowel or urinary habits, change in stools or urine, dysuria,hematuria,  rash, arthralgias, visual complaints, headache, numbness weakness or ataxia or problems with walking or coordination,  change in mood/affect or memory.          Past Medical Hx Osteoporosis  - fu McPhail, restart weekly actonel August 22, 2010  Health Maintenance ...........  Ladarrion Telfair - Td 10/2004  - CPX 07/03/2012  Colon Polyps  - colonoscopy 08/07/06 > tubulovillous  - Repeat colonscopy 10/ 6/11 neg    Family History:  Hypothryoid mother  Lung cancer father, smoker  siblings all younger all with thyroid problems   Social History:  never smoker  ETOH 2-3 x per wk  Retired                 Objective:   Physical Exam     Ambulatory healthy appearing rel thin wf in no acute distress.  wt 131 May 06 2008 > 134 August 22, 2010 > 138 01/01/2011 > 07/03/2012  137  HEENT: nl dentition, turbinates, and orophanx. Nl external ear canals without cough reflex  Neck without JVD/Nodes/TM  Lungs clear to A and P bilaterally without cough on insp or exp maneuvers  RRR no s3 or murmur or increase in P2  Abd soft and benign with nl excursion in the supine position. No  bruits or organomegaly  Ext warm without calf tenderness, cyanosis clubbing or edema  Skin warm and dry without lesions Neuro alert, approp, no motor or cerebellar deficits MS nl gait, no deformities or restrictions     Assessment & Plan:

## 2012-07-03 NOTE — Patient Instructions (Addendum)
Please remember to go to the lab  department downstairs for your tests - we will call you with the results when they are available.    Follow up  Can be as needed.

## 2012-07-04 DIAGNOSIS — Z86010 Personal history of colonic polyps: Secondary | ICD-10-CM | POA: Insufficient documentation

## 2012-07-04 LAB — VITAMIN D 25 HYDROXY (VIT D DEFICIENCY, FRACTURES): Vit D, 25-Hydroxy: 53 ng/mL (ref 30–89)

## 2012-07-04 NOTE — Assessment & Plan Note (Addendum)
colonoscopy 08/07/06 > tubulovillous  - Repeat colonscopy 10/ 6/11 neg .........  Leone Payor f/u

## 2012-07-04 NOTE — Assessment & Plan Note (Signed)
no def risk factors so target LDL < 160  Lab Results  Component Value Date   CHOL 229* 07/03/2012   HDL 70.00 07/03/2012   LDLDIRECT 148.0 07/03/2012   TRIG 49.0 07/03/2012   CHOLHDL 3 07/03/2012    High hdl protective, no med rx needed

## 2012-07-06 NOTE — Progress Notes (Signed)
Quick Note:  Spoke with pt and notified of results per Dr. Wert. Pt verbalized understanding and denied any questions.  ______ 

## 2012-07-07 ENCOUNTER — Encounter: Payer: BC Managed Care – PPO | Admitting: Internal Medicine

## 2012-07-08 ENCOUNTER — Encounter: Payer: BC Managed Care – PPO | Admitting: Internal Medicine

## 2012-08-18 ENCOUNTER — Other Ambulatory Visit: Payer: Self-pay | Admitting: Dermatology

## 2012-08-19 ENCOUNTER — Other Ambulatory Visit: Payer: Self-pay | Admitting: Gynecology

## 2012-08-19 ENCOUNTER — Other Ambulatory Visit: Payer: Self-pay | Admitting: *Deleted

## 2012-08-19 DIAGNOSIS — M8580 Other specified disorders of bone density and structure, unspecified site: Secondary | ICD-10-CM

## 2012-08-20 ENCOUNTER — Encounter: Payer: Self-pay | Admitting: Gynecology

## 2012-08-24 ENCOUNTER — Encounter: Payer: Self-pay | Admitting: Gynecology

## 2012-08-24 ENCOUNTER — Ambulatory Visit (INDEPENDENT_AMBULATORY_CARE_PROVIDER_SITE_OTHER): Payer: BC Managed Care – PPO | Admitting: Gynecology

## 2012-08-24 VITALS — BP 108/60 | Ht 67.0 in | Wt 135.0 lb

## 2012-08-24 DIAGNOSIS — M8580 Other specified disorders of bone density and structure, unspecified site: Secondary | ICD-10-CM

## 2012-08-24 DIAGNOSIS — Z01419 Encounter for gynecological examination (general) (routine) without abnormal findings: Secondary | ICD-10-CM

## 2012-08-24 DIAGNOSIS — M899 Disorder of bone, unspecified: Secondary | ICD-10-CM

## 2012-08-24 DIAGNOSIS — N952 Postmenopausal atrophic vaginitis: Secondary | ICD-10-CM

## 2012-08-24 DIAGNOSIS — Z13220 Encounter for screening for lipoid disorders: Secondary | ICD-10-CM

## 2012-08-24 LAB — CBC WITH DIFFERENTIAL/PLATELET
Eosinophils Relative: 2 % (ref 0–5)
HCT: 37.8 % (ref 36.0–46.0)
Lymphocytes Relative: 26 % (ref 12–46)
Lymphs Abs: 1.7 10*3/uL (ref 0.7–4.0)
MCV: 95.5 fL (ref 78.0–100.0)
Neutro Abs: 4.3 10*3/uL (ref 1.7–7.7)
Platelets: 323 10*3/uL (ref 150–400)
RBC: 3.96 MIL/uL (ref 3.87–5.11)
WBC: 6.5 10*3/uL (ref 4.0–10.5)

## 2012-08-24 LAB — COMPREHENSIVE METABOLIC PANEL
ALT: 13 U/L (ref 0–35)
Albumin: 4.3 g/dL (ref 3.5–5.2)
CO2: 28 mEq/L (ref 19–32)
Calcium: 9.4 mg/dL (ref 8.4–10.5)
Chloride: 102 mEq/L (ref 96–112)
Creat: 0.84 mg/dL (ref 0.50–1.10)
Potassium: 3.6 mEq/L (ref 3.5–5.3)
Total Protein: 6.8 g/dL (ref 6.0–8.3)

## 2012-08-24 LAB — TSH: TSH: 2.295 u[IU]/mL (ref 0.350–4.500)

## 2012-08-24 NOTE — Patient Instructions (Signed)
Login to my chart to check your lab results. Follow up in one year for annual exam.

## 2012-08-24 NOTE — Progress Notes (Signed)
Desyre Calma May 26, 1951 [id number]        61 y.o.  G0P0 for annual exam.  Doing well without complaints.  Past medical history,surgical history, medications, allergies, family history and social history were all reviewed and documented in the EPIC chart. ROS:  Was performed and pertinent positives and negatives are included in the history.  Exam: Sherrilyn Rist assistant Filed Vitals:   08/24/12 1056  BP: 108/60  Height: 5\' 7"  (1.702 m)  Weight: 135 lb (61.236 kg)   General appearance  Normal Skin grossly normal Head/Neck normal with no cervical or supraclavicular adenopathy thyroid normal Lungs  clear Cardiac RR, without RMG Abdominal  soft, nontender, without masses, organomegaly or hernia Breasts  examined lying and sitting without masses, retractions, discharge or axillary adenopathy.  Left nipple inverted as it has been. Easily reverts. Pelvic  Ext/BUS/vagina  normal with atrophic changes  Cervix  normal with atrophic changes  Uterus  anteverted, normal size, shape and contour, midline and mobile nontender   Adnexa  Without masses or tenderness    Anus and perineum  normal   Rectovaginal  normal sphincter tone without palpated masses or tenderness.    Assessment/Plan:  61 y.o. G0P0 female for annual exam.   1. Postmenopausal. No bleeding and doing well from a symptom standpoint. We'll continue to observe. Patient knows to report any bleeding. 2. DEXA 08/2012. T score -2.3 unchanged from 2011. History of bisphosphonates started in 2002 discontinued last year for drug-free holiday. Continue to monitor with recheck DEXA in 2 years.  Vitamin D 50 06/11/2012. 3. Pap smear 08/2011. No Pap smear done today. No history of abnormal Pap smears previously. Plan every 3 year Pap smear. 4. Colonoscopy 2011. Follow up with their recommended interval. 5. Mammography 08/2012. Continue with annual mammography.  Left nipple inverted as this has been for over 10 years. Easily reverts. SBE monthly  reviewed. 6. Health maintenance. Baseline CBC comp rancid metabolic panel lipid profile urinalysis TSH ordered. Follow up one year, sooner as needed.    Dara Lords MD, 11:25 AM 08/24/2012

## 2012-08-25 LAB — URINALYSIS W MICROSCOPIC + REFLEX CULTURE
Casts: NONE SEEN
Glucose, UA: NEGATIVE mg/dL
Nitrite: NEGATIVE
Specific Gravity, Urine: 1.005 (ref 1.005–1.030)
Squamous Epithelial / LPF: NONE SEEN
pH: 7 (ref 5.0–8.0)

## 2013-04-07 ENCOUNTER — Other Ambulatory Visit: Payer: Self-pay | Admitting: Dermatology

## 2013-08-23 ENCOUNTER — Encounter: Payer: Self-pay | Admitting: Gynecology

## 2013-08-25 ENCOUNTER — Ambulatory Visit (INDEPENDENT_AMBULATORY_CARE_PROVIDER_SITE_OTHER): Payer: BC Managed Care – PPO | Admitting: Gynecology

## 2013-08-25 ENCOUNTER — Encounter: Payer: Self-pay | Admitting: Gynecology

## 2013-08-25 VITALS — BP 116/72 | Ht 67.0 in | Wt 128.0 lb

## 2013-08-25 DIAGNOSIS — M8580 Other specified disorders of bone density and structure, unspecified site: Secondary | ICD-10-CM

## 2013-08-25 DIAGNOSIS — L292 Pruritus vulvae: Secondary | ICD-10-CM

## 2013-08-25 DIAGNOSIS — L293 Anogenital pruritus, unspecified: Secondary | ICD-10-CM

## 2013-08-25 DIAGNOSIS — M899 Disorder of bone, unspecified: Secondary | ICD-10-CM

## 2013-08-25 DIAGNOSIS — N952 Postmenopausal atrophic vaginitis: Secondary | ICD-10-CM

## 2013-08-25 DIAGNOSIS — Z01419 Encounter for gynecological examination (general) (routine) without abnormal findings: Secondary | ICD-10-CM

## 2013-08-25 LAB — CBC WITH DIFFERENTIAL/PLATELET
Basophils Absolute: 0.1 10*3/uL (ref 0.0–0.1)
Basophils Relative: 1 % (ref 0–1)
Eosinophils Relative: 2 % (ref 0–5)
HCT: 39.7 % (ref 36.0–46.0)
MCHC: 34.3 g/dL (ref 30.0–36.0)
MCV: 94.3 fL (ref 78.0–100.0)
Monocytes Absolute: 0.4 10*3/uL (ref 0.1–1.0)
Monocytes Relative: 7 % (ref 3–12)
Neutro Abs: 2.5 10*3/uL (ref 1.7–7.7)
Platelets: 298 10*3/uL (ref 150–400)
RBC: 4.21 MIL/uL (ref 3.87–5.11)
RDW: 13.5 % (ref 11.5–15.5)

## 2013-08-25 LAB — TSH: TSH: 2.36 u[IU]/mL (ref 0.350–4.500)

## 2013-08-25 LAB — LIPID PANEL
HDL: 75 mg/dL (ref >39)
LDL Cholesterol: 105 mg/dL — ABNORMAL HIGH (ref 0–99)
Total CHOL/HDL Ratio: 2.5 Ratio

## 2013-08-25 LAB — COMPREHENSIVE METABOLIC PANEL
ALT: 14 U/L (ref 0–35)
AST: 20 U/L (ref 0–37)
Alkaline Phosphatase: 68 U/L (ref 39–117)
BUN: 12 mg/dL (ref 6–23)
Creat: 0.8 mg/dL (ref 0.50–1.10)
Potassium: 3.7 mEq/L (ref 3.5–5.3)

## 2013-08-25 MED ORDER — CLOBETASOL PROPIONATE 0.05 % EX CREA: TOPICAL_CREAM | CUTANEOUS | Status: DC

## 2013-08-25 NOTE — Progress Notes (Signed)
Catherine [name] 1950-09-20 [id number]        62 y.o.  G0P0 for annual exam.  Several issues below.  Past medical history,surgical history, problem list, medications, allergies, family history and social history were all reviewed and documented in the EPIC chart.  ROS:  Performed and pertinent positives and negatives are included in the history, assessment and plan .  Exam: Kim assistant Filed Vitals:   08/25/13 1042  BP: 116/72  Height: 5\' 7"  (1.702 m)  Weight: 128 lb (58.06 kg)   General appearance  Normal Skin grossly normal Head/Neck normal with no cervical or supraclavicular adenopathy thyroid normal Lungs  clear Cardiac RR, without RMG Abdominal  soft, nontender, without masses, organomegaly or hernia Breasts  examined lying and sitting without masses, retractions, discharge or axillary adenopathy. Left nipple dimpled as always Pelvic  Ext/BUS/vagina  Normal with atrophic changes  Cervix  Normal with atrophic changes  Uterus  anteverted, normal size, shape and contour, midline and mobile nontender   Adnexa  Without masses or tenderness    Anus and perineum  Normal   Rectovaginal  Normal sphincter tone without palpated masses or tenderness.    Assessment/Plan:  62 y.o. G0P0 female for annual exam.   1. Postmenopausal/atrophic genital changes. Patient doing well without significant hot flushes, night sweats, vaginal dryness or dyspareunia. No vaginal bleeding. Will continue to monitor. Will call if any vaginal bleeding. 2. Occasional vulvar irritation. Uses Temovate 0.05% cream per Dr. Elana Alm. Mostly during the summer with sweating. Refill x12 provided. 3. Osteopenia. DEXA 08/2012 T score -2.3 unchanged from prior DEXA 2011. History of bisphosphate use from 2002 through 2012. We'll repeat DEXA next year at two-year interval. Increase calcium vitamin D reviewed. Check vitamin D level today. 4. Mammography 08/2013. Continue with annual mammography. Left nipple dimpled as always.  SBE monthly reviewed. 5. Pap smear 2012. Pap smear done today. No history of abnormal Pap smears previously. Plan repeat Pap smear next year a 3 year interval. 6. Colonoscopy 2011. Repeat at their recommended interval. 7. Health maintenance. Baseline CBC comprehensive metabolic panel lipid profile TSH vitamin D urinalysis ordered. Discussed varicella vaccine. Followup one year, sooner as needed.   Note: This document was prepared with digital dictation and possible smart phrase technology. Any transcriptional errors that result from this process are unintentional.   Dara Lords MD, 11:18 AM 08/25/2013

## 2013-08-25 NOTE — Patient Instructions (Signed)
Follow up in one year for annual exam 

## 2013-08-26 LAB — URINALYSIS W MICROSCOPIC + REFLEX CULTURE
Bacteria, UA: NONE SEEN
Bilirubin Urine: NEGATIVE
Casts: NONE SEEN
Crystals: NONE SEEN
Glucose, UA: NEGATIVE mg/dL
Hgb urine dipstick: NEGATIVE
Ketones, ur: NEGATIVE mg/dL
Nitrite: NEGATIVE
Protein, ur: NEGATIVE mg/dL
Specific Gravity, Urine: 1.008 (ref 1.005–1.030)
Urobilinogen, UA: 0.2 mg/dL (ref 0.0–1.0)
pH: 6 (ref 5.0–8.0)

## 2013-12-15 ENCOUNTER — Other Ambulatory Visit: Payer: Self-pay | Admitting: Dermatology

## 2014-06-24 ENCOUNTER — Other Ambulatory Visit: Payer: Self-pay

## 2014-08-09 DIAGNOSIS — M810 Age-related osteoporosis without current pathological fracture: Secondary | ICD-10-CM

## 2014-08-09 HISTORY — DX: Age-related osteoporosis without current pathological fracture: M81.0

## 2014-08-17 ENCOUNTER — Encounter: Payer: Self-pay | Admitting: Gynecology

## 2014-08-22 ENCOUNTER — Encounter: Payer: Self-pay | Admitting: Gynecology

## 2014-08-26 ENCOUNTER — Ambulatory Visit (INDEPENDENT_AMBULATORY_CARE_PROVIDER_SITE_OTHER): Payer: BC Managed Care – PPO | Admitting: Gynecology

## 2014-08-26 ENCOUNTER — Encounter: Payer: Self-pay | Admitting: Gynecology

## 2014-08-26 ENCOUNTER — Other Ambulatory Visit (HOSPITAL_COMMUNITY)
Admission: RE | Admit: 2014-08-26 | Discharge: 2014-08-26 | Disposition: A | Discharge status: Home / Self Care | Payer: BC Managed Care – PPO | Source: Ambulatory Visit | Attending: Gynecology | Admitting: Gynecology

## 2014-08-26 VITALS — BP 120/74 | Ht 67.0 in | Wt 136.0 lb

## 2014-08-26 DIAGNOSIS — N952 Postmenopausal atrophic vaginitis: Secondary | ICD-10-CM

## 2014-08-26 DIAGNOSIS — Z01419 Encounter for gynecological examination (general) (routine) without abnormal findings: Secondary | ICD-10-CM | POA: Diagnosis present

## 2014-08-26 DIAGNOSIS — Z1159 Encounter for screening for other viral diseases: Secondary | ICD-10-CM

## 2014-08-26 DIAGNOSIS — M810 Age-related osteoporosis without current pathological fracture: Secondary | ICD-10-CM

## 2014-08-26 LAB — LIPID PANEL
CHOL/HDL RATIO: 3.1 ratio
CHOLESTEROL: 168 mg/dL (ref 0–200)
HDL: 54 mg/dL (ref >39)
LDL Cholesterol: 101 mg/dL — ABNORMAL HIGH (ref 0–99)
Triglycerides: 65 mg/dL (ref <150)
VLDL: 13 mg/dL (ref 0–40)

## 2014-08-26 LAB — CBC WITH DIFFERENTIAL/PLATELET
BASOS ABS: 0 10*3/uL (ref 0.0–0.1)
BASOS PCT: 1 % (ref 0–1)
EOS PCT: 2 % (ref 0–5)
Eosinophils Absolute: 0.1 10*3/uL (ref 0.0–0.7)
HCT: 41 % (ref 36.0–46.0)
Hemoglobin: 13.4 g/dL (ref 12.0–15.0)
Lymphocytes Relative: 32 % (ref 12–46)
Lymphs Abs: 1.4 10*3/uL (ref 0.7–4.0)
MCH: 31.9 pg (ref 26.0–34.0)
MCHC: 32.7 g/dL (ref 30.0–36.0)
MCV: 97.6 fL (ref 78.0–100.0)
MPV: 8.7 fL — ABNORMAL LOW (ref 9.4–12.4)
Monocytes Absolute: 0.4 10*3/uL (ref 0.1–1.0)
Monocytes Relative: 8 % (ref 3–12)
Neutro Abs: 2.5 10*3/uL (ref 1.7–7.7)
Neutrophils Relative %: 57 % (ref 43–77)
PLATELETS: 525 10*3/uL — AB (ref 150–400)
RBC: 4.2 MIL/uL (ref 3.87–5.11)
RDW: 12.9 % (ref 11.5–15.5)
WBC: 4.4 10*3/uL (ref 4.0–10.5)

## 2014-08-26 LAB — COMPREHENSIVE METABOLIC PANEL
ALBUMIN: 3.9 g/dL (ref 3.5–5.2)
ALT: 12 U/L (ref 0–35)
AST: 18 U/L (ref 0–37)
Alkaline Phosphatase: 65 U/L (ref 39–117)
BILIRUBIN TOTAL: 0.5 mg/dL (ref 0.2–1.2)
BUN: 11 mg/dL (ref 6–23)
CO2: 26 mEq/L (ref 19–32)
Calcium: 9 mg/dL (ref 8.4–10.5)
Chloride: 104 mEq/L (ref 96–112)
Creat: 0.83 mg/dL (ref 0.50–1.10)
Glucose, Bld: 88 mg/dL (ref 70–99)
Potassium: 4.1 mEq/L (ref 3.5–5.3)
SODIUM: 140 meq/L (ref 135–145)
Total Protein: 7 g/dL (ref 6.0–8.3)

## 2014-08-26 LAB — TSH: TSH: 2.071 u[IU]/mL (ref 0.350–4.500)

## 2014-08-26 NOTE — Progress Notes (Signed)
Catherine [name] Jan 19, 1951 [id number]        63 y.o.  G0P0 for annual exam.  Several issues noted below.  Past medical history,surgical history, problem list, medications, allergies, family history and social history were all reviewed and documented as reviewed in the EPIC chart.  ROS:  Performed with pertinent positives and negatives included in the history, assessment and plan.   Additional significant findings :  none   Exam: Kim Counsellor Vitals:   08/26/14 0843  BP: 120/74  Height: 5\' 7"  (1.702 m)  Weight: 136 lb (61.689 kg)   General appearance:  Normal affect, orientation and appearance. Skin: Grossly normal HEENT: Without gross lesions.  No cervical or supraclavicular adenopathy. Thyroid normal.  Lungs:  Clear without wheezing, rales or rhonchi Cardiac: RR, without RMG Abdominal:  Soft, nontender, without masses, guarding, rebound, organomegaly or hernia Breasts:  Examined lying and sitting without masses, retractions, discharge or axillary adenopathy.  Left nipple indented as always, easily reverts Pelvic:  Ext/BUS/vagina with generalized atrophic changes  Cervix with atrophic changes  Uterus anteverted, normal size, shape and contour, midline and mobile nontender   Adnexa  Without masses or tenderness    Anus and perineum  Normal   Rectovaginal  Normal sphincter tone without palpated masses or tenderness.    Assessment/Plan:  63 y.o. G0P0 female for annual exam.   1. Postmenopausal/atrophic genital changes. Patient overall doing well without hot flashes, night sweats, vaginal dryness or dyspareunia. No vaginal bleeding. Continue to monitor. Report any vaginal bleeding. 2. Osteoporosis.  DEXA 08/2014 with T score -2.6. 4% loss of the spine. Had been on Actonel from 2002 through 2012. Will check baseline is to include copy has a metabolic panel and vitamin D TSH PTH. Options to treat now with alternative medications such as Prolia versus monitoring which short interval  study with repeat DEXA next year discussed. Side effect profile of Prolia reviewed to include osteonecrosis of the jaw, atypical fractures, rashes and infections. At this point patient prefers to hold on treatment and repeat the DEXA in 1 year and then go from there.  Calcium vitamin D supplementation reviewed. 3. Pap smear 2012.  Pap smear done today. No history of abnormal Pap smears previously. 4. Mammography 08/2014. Continue with annual mammography. SBE monthly reviewed. 5. Colonoscopy 2011. Repeat at their recommended interval. 6. Health maintenance. Baseline CBC, metabolic panel lipid profile urinalysis TSH PTH vitamin D ordered.  Hepatitis C screening also ordered per current CDC recommendations as she is in the age range. She has received influenza and Pneumovax. Recommended Zostavax.  Follow up in one year, sooner as needed.     Anastasio Auerbach MD, 9:21 AM 08/26/2014

## 2014-08-26 NOTE — Addendum Note (Signed)
Addended by: Nelva Nay on: 08/26/2014 09:59 AM   Modules accepted: Orders, SmartSet

## 2014-08-26 NOTE — Addendum Note (Signed)
Addended by: Anastasio Auerbach on: 08/26/2014 09:29 AM   Modules accepted: Orders

## 2014-08-26 NOTE — Patient Instructions (Signed)
You may obtain a copy of any labs that were done today by logging onto MyChart as outlined in the instructions provided with your AVS (after visit summary). The office will not call with normal lab results but certainly if there are any significant abnormalities then we will contact you.   Health Maintenance, Female A healthy lifestyle and preventative care can promote health and wellness.  Maintain regular health, dental, and eye exams.  Eat a healthy diet. Foods like vegetables, fruits, whole grains, low-fat dairy products, and lean protein foods contain the nutrients you need without too many calories. Decrease your intake of foods high in solid fats, added sugars, and salt. Get information about a proper diet from your caregiver, if necessary.  Regular physical exercise is one of the most important things you can do for your health. Most adults should get at least 150 minutes of moderate-intensity exercise (any activity that increases your heart rate and causes you to sweat) each week. In addition, most adults need muscle-strengthening exercises on 2 or more days a week.   Maintain a healthy weight. The body mass index (BMI) is a screening tool to identify possible weight problems. It provides an estimate of body fat based on height and weight. Your caregiver can help determine your BMI, and can help you achieve or maintain a healthy weight. For adults 20 years and older:  A BMI below 18.5 is considered underweight.  A BMI of 18.5 to 24.9 is normal.  A BMI of 25 to 29.9 is considered overweight.  A BMI of 30 and above is considered obese.  Maintain normal blood lipids and cholesterol by exercising and minimizing your intake of saturated fat. Eat a balanced diet with plenty of fruits and vegetables. Blood tests for lipids and cholesterol should begin at age 61 and be repeated every 5 years. If your lipid or cholesterol levels are high, you are over 50, or you are a high risk for heart  disease, you may need your cholesterol levels checked more frequently.Ongoing high lipid and cholesterol levels should be treated with medicines if diet and exercise are not effective.  If you smoke, find out from your caregiver how to quit. If you do not use tobacco, do not start.  Lung cancer screening is recommended for adults aged 33 80 years who are at high risk for developing lung cancer because of a history of smoking. Yearly low-dose computed tomography (CT) is recommended for people who have at least a 30-pack-year history of smoking and are a current smoker or have quit within the past 15 years. A pack year of smoking is smoking an average of 1 pack of cigarettes a day for 1 year (for example: 1 pack a day for 30 years or 2 packs a day for 15 years). Yearly screening should continue until the smoker has stopped smoking for at least 15 years. Yearly screening should also be stopped for people who develop a health problem that would prevent them from having lung cancer treatment.  If you are pregnant, do not drink alcohol. If you are breastfeeding, be very cautious about drinking alcohol. If you are not pregnant and choose to drink alcohol, do not exceed 1 drink per day. One drink is considered to be 12 ounces (355 mL) of beer, 5 ounces (148 mL) of wine, or 1.5 ounces (44 mL) of liquor.  Avoid use of street drugs. Do not share needles with anyone. Ask for help if you need support or instructions about stopping  the use of drugs.  High blood pressure causes heart disease and increases the risk of stroke. Blood pressure should be checked at least every 1 to 2 years. Ongoing high blood pressure should be treated with medicines, if weight loss and exercise are not effective.  If you are 59 to 64 years old, ask your caregiver if you should take aspirin to prevent strokes.  Diabetes screening involves taking a blood sample to check your fasting blood sugar level. This should be done once every 3  years, after age 91, if you are within normal weight and without risk factors for diabetes. Testing should be considered at a younger age or be carried out more frequently if you are overweight and have at least 1 risk factor for diabetes.  Breast cancer screening is essential preventative care for women. You should practice "breast self-awareness." This means understanding the normal appearance and feel of your breasts and may include breast self-examination. Any changes detected, no matter how small, should be reported to a caregiver. Women in their 66s and 30s should have a clinical breast exam (CBE) by a caregiver as part of a regular health exam every 1 to 3 years. After age 101, women should have a CBE every year. Starting at age 100, women should consider having a mammogram (breast X-ray) every year. Women who have a family history of breast cancer should talk to their caregiver about genetic screening. Women at a high risk of breast cancer should talk to their caregiver about having an MRI and a mammogram every year.  Breast cancer gene (BRCA)-related cancer risk assessment is recommended for women who have family members with BRCA-related cancers. BRCA-related cancers include breast, ovarian, tubal, and peritoneal cancers. Having family members with these cancers may be associated with an increased risk for harmful changes (mutations) in the breast cancer genes BRCA1 and BRCA2. Results of the assessment will determine the need for genetic counseling and BRCA1 and BRCA2 testing.  The Pap test is a screening test for cervical cancer. Women should have a Pap test starting at age 57. Between ages 25 and 35, Pap tests should be repeated every 2 years. Beginning at age 37, you should have a Pap test every 3 years as long as the past 3 Pap tests have been normal. If you had a hysterectomy for a problem that was not cancer or a condition that could lead to cancer, then you no longer need Pap tests. If you are  between ages 50 and 76, and you have had normal Pap tests going back 10 years, you no longer need Pap tests. If you have had past treatment for cervical cancer or a condition that could lead to cancer, you need Pap tests and screening for cancer for at least 20 years after your treatment. If Pap tests have been discontinued, risk factors (such as a new sexual partner) need to be reassessed to determine if screening should be resumed. Some women have medical problems that increase the chance of getting cervical cancer. In these cases, your caregiver may recommend more frequent screening and Pap tests.  The human papillomavirus (HPV) test is an additional test that may be used for cervical cancer screening. The HPV test looks for the virus that can cause the cell changes on the cervix. The cells collected during the Pap test can be tested for HPV. The HPV test could be used to screen women aged 44 years and older, and should be used in women of any age  who have unclear Pap test results. After the age of 55, women should have HPV testing at the same frequency as a Pap test.  Colorectal cancer can be detected and often prevented. Most routine colorectal cancer screening begins at the age of 44 and continues through age 20. However, your caregiver may recommend screening at an earlier age if you have risk factors for colon cancer. On a yearly basis, your caregiver may provide home test kits to check for hidden blood in the stool. Use of a small camera at the end of a tube, to directly examine the colon (sigmoidoscopy or colonoscopy), can detect the earliest forms of colorectal cancer. Talk to your caregiver about this at age 86, when routine screening begins. Direct examination of the colon should be repeated every 5 to 10 years through age 13, unless early forms of pre-cancerous polyps or small growths are found.  Hepatitis C blood testing is recommended for all people born from [birth year] through 1965 and any  individual with known risks for hepatitis C.  Practice safe sex. Use condoms and avoid high-risk sexual practices to reduce the spread of sexually transmitted infections (STIs). Sexually active women aged 36 and younger should be checked for Chlamydia, which is a common sexually transmitted infection. Older women with new or multiple partners should also be tested for Chlamydia. Testing for other STIs is recommended if you are sexually active and at increased risk.  Osteoporosis is a disease in which the bones lose minerals and strength with aging. This can result in serious bone fractures. The risk of osteoporosis can be identified using a bone density scan. Women ages 20 and over and women at risk for fractures or osteoporosis should discuss screening with their caregivers. Ask your caregiver whether you should be taking a calcium supplement or vitamin D to reduce the rate of osteoporosis.  Menopause can be associated with physical symptoms and risks. Hormone replacement therapy is available to decrease symptoms and risks. You should talk to your caregiver about whether hormone replacement therapy is right for you.  Use sunscreen. Apply sunscreen liberally and repeatedly throughout the day. You should seek shade when your shadow is shorter than you. Protect yourself by wearing long sleeves, pants, a wide-brimmed hat, and sunglasses year round, whenever you are outdoors.  Notify your caregiver of new moles or changes in moles, especially if there is a change in shape or color. Also notify your caregiver if a mole is larger than the size of a pencil eraser.  Stay current with your immunizations. Document Released: 03/11/2011 Document Revised: 12/21/2012 Document Reviewed: 03/11/2011 [facility] Patient Information 2014 [employer].

## 2014-08-27 LAB — URINALYSIS W MICROSCOPIC + REFLEX CULTURE
BACTERIA UA: NONE SEEN
BILIRUBIN URINE: NEGATIVE
Casts: NONE SEEN
Crystals: NONE SEEN
Glucose, UA: NEGATIVE mg/dL
Hgb urine dipstick: NEGATIVE
KETONES UR: NEGATIVE mg/dL
Leukocytes, UA: NEGATIVE
Nitrite: NEGATIVE
PH: 7.5 (ref 5.0–8.0)
Protein, ur: NEGATIVE mg/dL
SPECIFIC GRAVITY, URINE: 1.013 (ref 1.005–1.030)
Squamous Epithelial / LPF: NONE SEEN
Urobilinogen, UA: 0.2 mg/dL (ref 0.0–1.0)

## 2014-08-27 LAB — HEPATITIS C ANTIBODY: HCV AB: REACTIVE — AB

## 2014-08-27 LAB — VITAMIN D 25 HYDROXY (VIT D DEFICIENCY, FRACTURES): Vit D, 25-Hydroxy: 42 ng/mL (ref 30–100)

## 2014-08-29 LAB — HEPATITIS C RNA QUANTITATIVE: HCV Quantitative: NOT DETECTED IU/mL (ref <15)

## 2014-08-29 LAB — PARATHYROID HORMONE, INTACT (NO CA): PTH: 45 pg/mL (ref 14–64)

## 2014-08-29 LAB — CYTOLOGY - PAP

## 2014-09-13 ENCOUNTER — Other Ambulatory Visit: Payer: Self-pay | Admitting: Gynecology

## 2014-09-13 DIAGNOSIS — R768 Other specified abnormal immunological findings in serum: Secondary | ICD-10-CM

## 2014-09-13 DIAGNOSIS — D696 Thrombocytopenia, unspecified: Secondary | ICD-10-CM

## 2014-10-06 ENCOUNTER — Telehealth: Payer: Self-pay

## 2014-10-06 NOTE — Telephone Encounter (Signed)
Patient called and expressed concern over recent lab results. Once again I read her Dr. Dorette Grate note on lab results and reassured her.  She said Dr. Loetta Rough recommended she get a PCP now that she is 74. I gave her the number for [facility].

## 2015-02-14 ENCOUNTER — Other Ambulatory Visit: Payer: Medicare PPO

## 2015-02-14 DIAGNOSIS — D696 Thrombocytopenia, unspecified: Secondary | ICD-10-CM

## 2015-02-14 DIAGNOSIS — R768 Other specified abnormal immunological findings in serum: Secondary | ICD-10-CM

## 2015-02-14 LAB — CBC WITH DIFFERENTIAL/PLATELET
BASOS PCT: 1 % (ref 0–1)
Basophils Absolute: 0.1 10*3/uL (ref 0.0–0.1)
EOS ABS: 0.1 10*3/uL (ref 0.0–0.7)
Eosinophils Relative: 2 % (ref 0–5)
HCT: 36.2 % (ref 36.0–46.0)
HEMOGLOBIN: 11.9 g/dL — AB (ref 12.0–15.0)
LYMPHS ABS: 1.5 10*3/uL (ref 0.7–4.0)
LYMPHS PCT: 28 % (ref 12–46)
MCH: 31.5 pg (ref 26.0–34.0)
MCHC: 32.9 g/dL (ref 30.0–36.0)
MCV: 95.8 fL (ref 78.0–100.0)
MONO ABS: 0.3 10*3/uL (ref 0.1–1.0)
MONOS PCT: 6 % (ref 3–12)
MPV: 9.7 fL (ref 8.6–12.4)
Neutro Abs: 3.4 10*3/uL (ref 1.7–7.7)
Neutrophils Relative %: 63 % (ref 43–77)
Platelets: 300 10*3/uL (ref 150–400)
RBC: 3.78 MIL/uL — ABNORMAL LOW (ref 3.87–5.11)
RDW: 13.4 % (ref 11.5–15.5)
WBC: 5.4 10*3/uL (ref 4.0–10.5)

## 2015-02-15 LAB — HEPATITIS C ANTIBODY: HCV AB: NEGATIVE

## 2015-03-06 ENCOUNTER — Other Ambulatory Visit: Payer: Self-pay

## 2015-06-23 ENCOUNTER — Telehealth: Payer: Self-pay | Admitting: *Deleted

## 2015-06-23 NOTE — Telephone Encounter (Signed)
If patient is on Medicare I cannot order routine labs on her at age 64. She really should have a primary physician other than myself.  If I would order labs it would be CBC comprehensive metabolic panel lipid profile urinalysis TSH and vitamin D.

## 2015-06-23 NOTE — Telephone Encounter (Signed)
Dr.Fontaine please see the below

## 2015-06-23 NOTE — Telephone Encounter (Signed)
-----   Message from Sinclair Grooms sent at 06/23/2015 12:55 PM EDT ----- Regarding: lab orders Patient wants to come in prior to her physical for labs. Can you please ask TF what labs he wants patient to have and place orders. Thx

## 2015-06-26 NOTE — Telephone Encounter (Signed)
Left the below on pt voicemail. 

## 2015-07-25 ENCOUNTER — Encounter: Payer: Self-pay | Admitting: Internal Medicine

## 2015-08-07 ENCOUNTER — Encounter: Payer: Self-pay | Admitting: Internal Medicine

## 2015-08-09 ENCOUNTER — Telehealth: Payer: Self-pay | Admitting: Internal Medicine

## 2015-08-09 NOTE — Telephone Encounter (Signed)
Patient notifeid that she is due for a 5 year recall and not a 10 year due to her history of tubular adenoma in 2007.  She wants to call back to schedule once she looks at her calendar.

## 2015-08-11 ENCOUNTER — Encounter: Payer: Self-pay | Admitting: Internal Medicine

## 2015-08-16 ENCOUNTER — Encounter: Payer: Self-pay | Admitting: Internal Medicine

## 2015-08-16 ENCOUNTER — Ambulatory Visit (INDEPENDENT_AMBULATORY_CARE_PROVIDER_SITE_OTHER): Payer: Medicare PPO | Admitting: Internal Medicine

## 2015-08-16 ENCOUNTER — Other Ambulatory Visit (INDEPENDENT_AMBULATORY_CARE_PROVIDER_SITE_OTHER): Payer: Medicare PPO

## 2015-08-16 VITALS — BP 124/82 | HR 56 | Temp 97.8°F | Resp 16 | Ht 66.5 in | Wt 137.0 lb

## 2015-08-16 DIAGNOSIS — Z0000 Encounter for general adult medical examination without abnormal findings: Secondary | ICD-10-CM

## 2015-08-16 DIAGNOSIS — M8580 Other specified disorders of bone density and structure, unspecified site: Secondary | ICD-10-CM

## 2015-08-16 DIAGNOSIS — M810 Age-related osteoporosis without current pathological fracture: Secondary | ICD-10-CM | POA: Diagnosis not present

## 2015-08-16 DIAGNOSIS — E7800 Pure hypercholesterolemia, unspecified: Secondary | ICD-10-CM | POA: Diagnosis not present

## 2015-08-16 DIAGNOSIS — Z23 Encounter for immunization: Secondary | ICD-10-CM

## 2015-08-16 LAB — COMPREHENSIVE METABOLIC PANEL
ALBUMIN: 4.3 g/dL (ref 3.5–5.2)
ALT: 12 U/L (ref 0–35)
AST: 20 U/L (ref 0–37)
Alkaline Phosphatase: 63 U/L (ref 39–117)
BUN: 13 mg/dL (ref 6–23)
CHLORIDE: 105 meq/L (ref 96–112)
CO2: 29 mEq/L (ref 19–32)
CREATININE: 0.84 mg/dL (ref 0.40–1.20)
Calcium: 9.4 mg/dL (ref 8.4–10.5)
GFR: 72.13 mL/min (ref >60.00)
Glucose, Bld: 92 mg/dL (ref 70–99)
Potassium: 4.5 mEq/L (ref 3.5–5.1)
SODIUM: 143 meq/L (ref 135–145)
Total Bilirubin: 0.6 mg/dL (ref 0.2–1.2)
Total Protein: 7.2 g/dL (ref 6.0–8.3)

## 2015-08-16 LAB — CBC WITH DIFFERENTIAL/PLATELET
BASOS PCT: 0.6 % (ref 0.0–3.0)
Basophils Absolute: 0 10*3/uL (ref 0.0–0.1)
EOS ABS: 0.1 10*3/uL (ref 0.0–0.7)
Eosinophils Relative: 1.6 % (ref 0.0–5.0)
HCT: 38.7 % (ref 36.0–46.0)
HEMOGLOBIN: 12.8 g/dL (ref 12.0–15.0)
Lymphocytes Relative: 33.1 % (ref 12.0–46.0)
Lymphs Abs: 1.6 10*3/uL (ref 0.7–4.0)
MCHC: 33.1 g/dL (ref 30.0–36.0)
MCV: 95.3 fl (ref 78.0–100.0)
MONO ABS: 0.4 10*3/uL (ref 0.1–1.0)
Monocytes Relative: 8.8 % (ref 3.0–12.0)
NEUTROS ABS: 2.6 10*3/uL (ref 1.4–7.7)
NEUTROS PCT: 55.9 % (ref 43.0–77.0)
PLATELETS: 313 10*3/uL (ref 150.0–400.0)
RBC: 4.06 Mil/uL (ref 3.87–5.11)
RDW: 13.7 % (ref 11.5–15.5)
WBC: 4.7 10*3/uL (ref 4.0–10.5)

## 2015-08-16 LAB — LIPID PANEL
CHOLESTEROL: 196 mg/dL (ref 0–200)
HDL: 71.8 mg/dL (ref >39.00)
LDL Cholesterol: 113 mg/dL — ABNORMAL HIGH (ref 0–99)
NonHDL: 123.84
TRIGLYCERIDES: 54 mg/dL (ref 0.0–149.0)
Total CHOL/HDL Ratio: 3
VLDL: 10.8 mg/dL (ref 0.0–40.0)

## 2015-08-16 LAB — TSH: TSH: 3.37 u[IU]/mL (ref 0.35–4.50)

## 2015-08-16 NOTE — Patient Instructions (Signed)
We have reviewed your prior records including labs and tests today.  Test(s) ordered today. Your results will be released to [facility] (or called to you) after review, usually within 72hours after test completion. If any changes need to be made, you will be notified at that same time.  All other Health Maintenance issues reviewed.   All recommended immunizations and age-appropriate screenings are up-to-date.  No immunizations administered today.   Medications reviewed and updated.   No changes recommended at this time.   Health Maintenance, Female Adopting a healthy lifestyle and getting preventive care can go a long way to promote health and wellness. Talk with your health care provider about what schedule of regular examinations is right for you. This is a good chance for you to check in with your provider about disease prevention and staying healthy. In between checkups, there are plenty of things you can do on your own. Experts have done a lot of research about which lifestyle changes and preventive measures are most likely to keep you healthy. Ask your health care provider for more information. WEIGHT AND DIET  Eat a healthy diet  Be sure to include plenty of vegetables, fruits, low-fat dairy products, and lean protein.  Do not eat a lot of foods high in solid fats, added sugars, or salt.  Get regular exercise. This is one of the most important things you can do for your health.  Most adults should exercise for at least 150 minutes each week. The exercise should increase your heart rate and make you sweat (moderate-intensity exercise).  Most adults should also do strengthening exercises at least twice a week. This is in addition to the moderate-intensity exercise.  Maintain a healthy weight  Body mass index (BMI) is a measurement that can be used to identify possible weight problems. It estimates body fat based on height and weight. Your health care provider can help determine your  BMI and help you achieve or maintain a healthy weight.  For females 47 years of age and older:   A BMI below 18.5 is considered underweight.  A BMI of 18.5 to 24.9 is normal.  A BMI of 25 to 29.9 is considered overweight.  A BMI of 30 and above is considered obese.  Watch levels of cholesterol and blood lipids  You should start having your blood tested for lipids and cholesterol at 65 years of age, then have this test every 5 years.  You may need to have your cholesterol levels checked more often if:  Your lipid or cholesterol levels are high.  You are older than 65 years of age.  You are at high risk for heart disease.  CANCER SCREENING   Lung Cancer  Lung cancer screening is recommended for adults 18-65 years old who are at high risk for lung cancer because of a history of smoking.  A yearly low-dose CT scan of the lungs is recommended for people who:  Currently smoke.  Have quit within the past 15 years.  Have at least a 30-pack-year history of smoking. A pack year is smoking an average of one pack of cigarettes a day for 1 year.  Yearly screening should continue until it has been 15 years since you quit.  Yearly screening should stop if you develop a health problem that would prevent you from having lung cancer treatment.  Breast Cancer  Practice breast self-awareness. This means understanding how your breasts normally appear and feel.  It also means doing regular breast self-exams. Let  your health care provider know about any changes, no matter how small.  If you are in your 20s or 30s, you should have a clinical breast exam (CBE) by a health care provider every 1-3 years as part of a regular health exam.  If you are 48 or older, have a CBE every year. Also consider having a breast X-ray (mammogram) every year.  If you have a family history of breast cancer, talk to your health care provider about genetic screening.  If you are at high risk for breast  cancer, talk to your health care provider about having an MRI and a mammogram every year.  Breast cancer gene (BRCA) assessment is recommended for women who have family members with BRCA-related cancers. BRCA-related cancers include:  Breast.  Ovarian.  Tubal.  Peritoneal cancers.  Results of the assessment will determine the need for genetic counseling and BRCA1 and BRCA2 testing. Cervical Cancer Your health care provider may recommend that you be screened regularly for cancer of the pelvic organs (ovaries, uterus, and vagina). This screening involves a pelvic examination, including checking for microscopic changes to the surface of your cervix (Pap test). You may be encouraged to have this screening done every 3 years, beginning at age 6.  For women ages 4-65, health care providers may recommend pelvic exams and Pap testing every 3 years, or they may recommend the Pap and pelvic exam, combined with testing for human papilloma virus (HPV), every 5 years. Some types of HPV increase your risk of cervical cancer. Testing for HPV may also be done on women of any age with unclear Pap test results.  Other health care providers may not recommend any screening for nonpregnant women who are considered low risk for pelvic cancer and who do not have symptoms. Ask your health care provider if a screening pelvic exam is right for you.  If you have had past treatment for cervical cancer or a condition that could lead to cancer, you need Pap tests and screening for cancer for at least 20 years after your treatment. If Pap tests have been discontinued, your risk factors (such as having a new sexual partner) need to be reassessed to determine if screening should resume. Some women have medical problems that increase the chance of getting cervical cancer. In these cases, your health care provider may recommend more frequent screening and Pap tests. Colorectal Cancer  This type of cancer can be detected and  often prevented.  Routine colorectal cancer screening usually begins at 65 years of age and continues through 65 years of age.  Your health care provider may recommend screening at an earlier age if you have risk factors for colon cancer.  Your health care provider may also recommend using home test kits to check for hidden blood in the stool.  A small camera at the end of a tube can be used to examine your colon directly (sigmoidoscopy or colonoscopy). This is done to check for the earliest forms of colorectal cancer.  Routine screening usually begins at age 76.  Direct examination of the colon should be repeated every 5-10 years through 65 years of age. However, you may need to be screened more often if early forms of precancerous polyps or small growths are found. Skin Cancer  Check your skin from head to toe regularly.  Tell your health care provider about any new moles or changes in moles, especially if there is a change in a mole's shape or color.  Also tell your  care provider if you have a mole that is larger than the size of a pencil eraser.  Always use sunscreen. Apply sunscreen liberally and repeatedly throughout the day.  Protect yourself by wearing long sleeves, pants, a wide-brimmed hat, and sunglasses whenever you are outside. HEART DISEASE, DIABETES, AND HIGH BLOOD PRESSURE   High blood pressure causes heart disease and increases the risk of stroke. High blood pressure is more likely to develop in:  People who have blood pressure in the high end of the normal range (130-139/85-89 mm Hg).  People who are overweight or obese.  People who are African American.  If you are 18-39 years of age, have your blood pressure checked every 3-5 years. If you are 40 years of age or older, have your blood pressure checked every year. You should have your blood pressure measured twice--once when you are at a hospital or clinic, and once when you are not at a hospital or clinic.  Record the average of the two measurements. To check your blood pressure when you are not at a hospital or clinic, you can use:  An automated blood pressure machine at a pharmacy.  A home blood pressure monitor.  If you are between 55 years and 79 years old, ask your health care provider if you should take aspirin to prevent strokes.  Have regular diabetes screenings. This involves taking a blood sample to check your fasting blood sugar level.  If you are at a normal weight and have a low risk for diabetes, have this test once every three years after 65 years of age.  If you are overweight and have a high risk for diabetes, consider being tested at a younger age or more often. PREVENTING INFECTION  Hepatitis B  If you have a higher risk for hepatitis B, you should be screened for this virus. You are considered at high risk for hepatitis B if:  You were born in a country where hepatitis B is common. Ask your health care provider which countries are considered high risk.  Your parents were born in a high-risk country, and you have not been immunized against hepatitis B (hepatitis B vaccine).  You have HIV or AIDS.  You use needles to inject street drugs.  You live with someone who has hepatitis B.  You have had sex with someone who has hepatitis B.  You get hemodialysis treatment.  You take certain medicines for conditions, including cancer, organ transplantation, and autoimmune conditions. Hepatitis C  Blood testing is recommended for:  Everyone born from 1945 through 1965.  Anyone with known risk factors for hepatitis C. Sexually transmitted infections (STIs)  You should be screened for sexually transmitted infections (STIs) including gonorrhea and chlamydia if:  You are sexually active and are younger than 65 years of age.  You are older than 65 years of age and your health care provider tells you that you are at risk for this type of infection.  Your sexual activity  has changed since you were last screened and you are at an increased risk for chlamydia or gonorrhea. Ask your health care provider if you are at risk.  If you do not have HIV, but are at risk, it may be recommended that you take a prescription medicine daily to prevent HIV infection. This is called pre-exposure prophylaxis (PrEP). You are considered at risk if:  You are sexually active and do not regularly use condoms or know the HIV status of your partner(s).  You take   take drugs by injection.  You are sexually active with a partner who has HIV. Talk with your health care provider about whether you are at high risk of being infected with HIV. If you choose to begin PrEP, you should first be tested for HIV. You should then be tested every 3 months for as long as you are taking PrEP.  PREGNANCY   If you are premenopausal and you may become pregnant, ask your health care provider about preconception counseling.  If you may become pregnant, take 400 to 800 micrograms (mcg) of folic acid every day.  If you want to prevent pregnancy, talk to your health care provider about birth control (contraception). OSTEOPOROSIS AND MENOPAUSE   Osteoporosis is a disease in which the bones lose minerals and strength with aging. This can result in serious bone fractures. Your risk for osteoporosis can be identified using a bone density scan.  If you are 69 years of age or older, or if you are at risk for osteoporosis and fractures, ask your health care provider if you should be screened.  Ask your health care provider whether you should take a calcium or vitamin D supplement to lower your risk for osteoporosis.  Menopause may have certain physical symptoms and risks.  Hormone replacement therapy may reduce some of these symptoms and risks. Talk to your health care provider about whether hormone replacement therapy is right for you.  HOME CARE INSTRUCTIONS   Schedule regular health, dental, and eye  exams.  Stay current with your immunizations.   Do not use any tobacco products including cigarettes, chewing tobacco, or electronic cigarettes.  If you are pregnant, do not drink alcohol.  If you are breastfeeding, limit how much and how often you drink alcohol.  Limit alcohol intake to no more than 1 drink per day for nonpregnant women. One drink equals 12 ounces of beer, 5 ounces of wine, or 1 ounces of hard liquor.  Do not use street drugs.  Do not share needles.  Ask your health care provider for help if you need support or information about quitting drugs.  Tell your health care provider if you often feel depressed.  Tell your health care provider if you have ever been abused or do not feel safe at home.   This information is not intended to replace advice given to you by your health care provider. Make sure you discuss any questions you have with your health care provider.   Document Released: 03/11/2011 Document Revised: 09/16/2014 Document Reviewed: 07/28/2013 Elsevier Interactive Patient Education [employer].

## 2015-08-16 NOTE — Progress Notes (Signed)
Pre visit review using our clinic review tool, if applicable. No additional management support is needed unless otherwise documented below in the visit note. 

## 2015-08-16 NOTE — Progress Notes (Signed)
Subjective:    Patient ID: Catherine [name], female    DOB: 09/06/1951, 64 y.o.   MRN: [id number]  HPI She is here to establish with a new pcp.   Here for medicare wellness.   I have personally reviewed and have noted 1.The patient's medical and social history 2.Their use of alcohol, tobacco or illicit drugs 3.Their current medications and supplements 4.The patient's functional ability including ADL's, fall risks, home safety risks and hearing or visual impairment. 5.Diet and physical activities 6.Evidence for depression or mood disorders 7.Care team reviewed and updated (available in snapshot or listed below in assessment and plan)   Are there smokers in your home (other than you)? No  Risk Factors Exercise: regular Dietary issues discussed: eats very healthy - cooks at home  Cardiac risk factors: advanced age   Depression Screen  Have you felt down, depressed or hopeless? No  Have you felt little interest or pleasure in doing things?  No Activities of Daily Living In your present state of health, do you have any difficulty performing the following activities?:  Driving? Yes Managing money?  Yes Feeding yourself? Yes Getting from bed to chair? Yes Climbing a flight of stairs? Yes Preparing food and eating?: Yes Bathing or showering? Yes Getting dressed: Yes Getting to/using the toilet? Yes Moving around from place to place: Yes In the past year have you fallen or had a near fall?: No  Are you sexually active?  No  Do you have more than one partner?  N/A  Hearing Difficulties: No Do you often ask people to speak up or repeat themselves? No Do you experience ringing or noises in your ears? No Do you have difficulty understanding soft or whispered voices? No Vision:              Any change in vision: no              Up to date with eye exam: yes Memory:  Do you feel that you have a problem with memory?  No  Do you often misplace items? No  Do you feel safe at home?  Yes  Cognitive Testing  Alert, Orientated? Yes  Normal Appearance? Yes  Recall of three objects?  Yes  Can perform simple calculations? Yes  Displays appropriate judgment? Yes  Can read the correct time from a watch face? Yes   Advanced Directives have been discussed with the patient? Yes                           Osteoporosis:  Her last bone density showed osteoporosis - it was done last year.  She is having another one tomorrow.  She was on actonel for 5 years.  She would like to avoid medication.  She is taking calcium and vitamin D. She exercises regularly.  Medications and allergies reviewed with patient and updated.  Patient Active Problem List   Diagnosis Date Noted  . Osteoporosis 08/16/2015  . Hx of colonic polyps 07/04/2012  . HYPERCHOLESTEROLEMIA 05/06/2008    Current Outpatient Prescriptions on File Prior to Visit  Medication Sig Dispense Refill  . calcium gluconate 500 MG tablet Take 500 mg by mouth daily.    . Cholecalciferol (VITAMIN D) 1000 UNITS capsule Take 1,000 Units by mouth daily.      . clobetasol cream (TEMOVATE) 0.05 % Apply as needed for irritation 30 g 1  . glucosamine-chondroitin 500-400 MG tablet Take 1 tablet by mouth  daily.      . Multiple Vitamin (MULTIVITAMIN) capsule Take 1 capsule by mouth daily.      . Omega-3 Fatty Acids (FISH OIL) 1000 MG CAPS Take 1 capsule by mouth daily.       No current facility-administered medications on file prior to visit.    Past Medical History  Diagnosis Date  . Colon polyp   . Osteoporosis 08/2014    T score -2.6    Past Surgical History  Procedure Laterality Date  . No past surgeries      Social History   Social History  . Marital Status: Married    Spouse Name: N/A  . Number of Children: N/A  . Years of Education: N/A   Occupational History  . retired    Social History Main Topics  . Smoking status: Never Smoker   .  Smokeless tobacco: Never Used  . Alcohol Use: Yes     Comment: wine with dinner  . Drug Use: No  . Sexual Activity: Yes    Birth Control/ Protection: Post-menopausal   Other Topics Concern  . None   Social History Narrative   Exercises regularly    Review of Systems  Constitutional: Negative for fever, chills, appetite change and fatigue.  HENT: Positive for hearing loss (high pitch hearing loss only, no problem in crowded room).   Eyes: Negative for visual disturbance.  Respiratory: Negative for cough, shortness of breath and wheezing.   Cardiovascular: Negative for chest pain, palpitations and leg swelling.  Gastrointestinal: Negative for nausea, abdominal pain, diarrhea, constipation and blood in stool.       Rare GERD  Genitourinary: Negative for dysuria and hematuria.  Musculoskeletal: Negative for back pain and arthralgias.  Skin: Negative for rash.  Neurological: Negative for dizziness, weakness, light-headedness, numbness and headaches.  Psychiatric/Behavioral: Negative for dysphoric mood. The patient is not nervous/anxious.        Objective:   Filed Vitals:   08/16/15 0826  BP: 124/82  Pulse: 56  Temp: 97.8 F (36.6 C)  Resp: 16   Filed Weights   08/16/15 0826  Weight: 137 lb (62.143 kg)   Body mass index is 21.78 kg/(m^2).   Physical Exam Constitutional: She appears well-developed and well-nourished. No distress.  HENT:  Head: Normocephalic and atraumatic.  Right Ear: External ear normal.  Left Ear: External ear normal.  Mouth/Throat: Oropharynx is clear and moist.  Normal bilateral ear canals and tympanic membranes  Eyes: Conjunctivae and EOM are normal.  Neck: Neck supple. No tracheal deviation present. No thyromegaly present.  No carotid bruit  Cardiovascular: Normal rate, regular rhythm and normal heart sounds.   No murmur heard. Pulmonary/Chest: Effort normal and breath sounds normal. No respiratory distress. She has no wheezes. She has no  rales.  Breast: Normal bilateral breast exam-no lumps, skin changes, nipple discharge; no axillary lymphadenopathy Abdominal: Soft. She exhibits no distension. There is no tenderness.  Musculoskeletal: She exhibits no edema.  Lymphadenopathy:    She has no cervical adenopathy.  Skin: Skin is warm and dry. She is not diaphoretic.  Psychiatric: She has a normal mood and affect. Her behavior is normal.       Assessment & Plan:   Physical exam: Immunizations up to date. Already had flu shot; Prevnar today Eye exam - every two years - Dr Claudean Kinds GYN  - Dr Phineas Real, up-to-date Derm - sees Dr. Janett Labella will be done tomorrow Colonoscopy up-to-date Mammogram up-to-date BMI normal range Exercising regularly Healthy diet  No evidence of depression No evidence of substance abuse  Overall healthy and currently only on supplements  Follow-up annually, sooner if needed

## 2015-08-17 LAB — HEPATITIS C ANTIBODY: HCV Ab: NEGATIVE

## 2015-08-20 LAB — VITAMIN D 1,25 DIHYDROXY
VITAMIN D3 1, 25 (OH): 58 pg/mL
Vitamin D 1, 25 (OH)2 Total: 58 pg/mL (ref 18–72)

## 2015-08-21 ENCOUNTER — Encounter: Payer: Self-pay | Admitting: Gynecology

## 2015-08-21 ENCOUNTER — Encounter: Payer: Self-pay | Admitting: Internal Medicine

## 2015-08-29 ENCOUNTER — Ambulatory Visit (INDEPENDENT_AMBULATORY_CARE_PROVIDER_SITE_OTHER): Payer: Medicare PPO | Admitting: Gynecology

## 2015-08-29 ENCOUNTER — Encounter: Payer: BC Managed Care – PPO | Admitting: Gynecology

## 2015-08-29 ENCOUNTER — Encounter: Payer: Self-pay | Admitting: Gynecology

## 2015-08-29 VITALS — BP 120/76 | Ht 67.0 in | Wt 136.0 lb

## 2015-08-29 DIAGNOSIS — M810 Age-related osteoporosis without current pathological fracture: Secondary | ICD-10-CM

## 2015-08-29 DIAGNOSIS — Z01419 Encounter for gynecological examination (general) (routine) without abnormal findings: Secondary | ICD-10-CM

## 2015-08-29 DIAGNOSIS — N762 Acute vulvitis: Secondary | ICD-10-CM

## 2015-08-29 DIAGNOSIS — N952 Postmenopausal atrophic vaginitis: Secondary | ICD-10-CM

## 2015-08-29 MED ORDER — NYSTATIN-TRIAMCINOLONE 100000-0.1 UNIT/GM-% EX OINT: 1.0000 "application " | TOPICAL_OINTMENT | Freq: Two times a day (BID) | CUTANEOUS | Status: DC

## 2015-08-29 NOTE — Progress Notes (Signed)
Akiya Kadrmas [birth year] [id number]        64 y.o.  G0P0  for breast and pelvic exam. Several issues noted below.  Past medical history,surgical history, problem list, medications, allergies, family history and social history were all reviewed and documented as reviewed in the EPIC chart.  ROS:  Performed with pertinent positives and negatives included in the history, assessment and plan.   Additional significant findings :  none   Exam: Kim Counsellor Vitals:   08/29/15 1427  BP: 120/76  Height: 5\' 7"  (1.702 m)  Weight: 136 lb (61.689 kg)   General appearance:  Normal affect, orientation and appearance. Skin: Grossly normal HEENT: Without gross lesions.  No cervical or supraclavicular adenopathy. Thyroid normal.  Lungs:  Clear without wheezing, rales or rhonchi Cardiac: RR, without RMG Abdominal:  Soft, nontender, without masses, guarding, rebound, organomegaly or hernia Breasts:  Examined lying and sitting without masses, retractions, discharge or axillary adenopathy.  Left nipple invented as always and easily reverts Pelvic:  Ext/BUS/vagina with atrophic changes  Cervix with atrophic changes  Uterus anteverted, normal size, shape and contour, midline and mobile nontender   Adnexa  Without masses or tenderness    Anus and perineum  Normal   Rectovaginal  Normal sphincter tone without palpated masses or tenderness.    Assessment/Plan:  64 y.o. G0P0 female for breast and pelvic exam.   1. Postmenopausal/atrophic genital changes. Patient without significant hot flushes, night sweats, vaginal dryness or any vaginal bleeding. Continue monitor report any issues or vaginal bleeding. 2. Intermittent vaginal itching. Patient had been using clobetasol per Dr. Ree Edman. Does not happen consistently. No discharge or odor. Recommended trial of Mytrex [place] if this doesn't help and 30 g prescription with 2 refills provided. 3. Osteoporosis. DEXA 08/2014 T score -2.6. 4% loss at the spine.  Had been on Actonel 2002 through 2012.  TSH, PTH, vitamin D all normal. Discussed options for treatment last year and she declined treatment but preferred observation. We'll plan repeat DEXA next year at 2 year interval 4. Mammography 08/2015. Continue with annual mammography when due. 5. Colonoscopy scheduled this coming February at a 5 year interval due to finding polyps previously. 6. Pap smear 08/2014. No Pap smear done today. No history of significant abnormal Pap smears. 7. Health maintenance. No routine lab work done as this is done through her primary physician's office. Follow up in one year, sooner as needed.   Anastasio Auerbach MD, 2:58 PM 08/29/2015

## 2015-08-29 NOTE — Patient Instructions (Signed)

## 2015-09-10 DIAGNOSIS — K635 Polyp of colon: Secondary | ICD-10-CM

## 2015-09-10 HISTORY — DX: Polyp of colon: K63.5

## 2015-10-04 ENCOUNTER — Ambulatory Visit (AMBULATORY_SURGERY_CENTER): Payer: Self-pay | Admitting: *Deleted

## 2015-10-04 VITALS — Ht 67.0 in | Wt 137.0 lb

## 2015-10-04 DIAGNOSIS — Z86010 Personal history of colonic polyps: Secondary | ICD-10-CM

## 2015-10-04 NOTE — Progress Notes (Signed)
No egg or soy allergy known to patient  No issues with past sedation with any surgeries  or procedures, never been  Intubated  No diet pills per patient No home 02 use per patient  No blood thinners per patient   emmi declined

## 2015-10-16 ENCOUNTER — Ambulatory Visit (AMBULATORY_SURGERY_CENTER): Payer: Medicare Other | Admitting: Internal Medicine

## 2015-10-16 ENCOUNTER — Encounter: Payer: Self-pay | Admitting: Internal Medicine

## 2015-10-16 VITALS — BP 118/58 | HR 61 | Temp 97.5°F | Resp 24 | Ht 67.0 in | Wt 136.0 lb

## 2015-10-16 DIAGNOSIS — D120 Benign neoplasm of cecum: Secondary | ICD-10-CM

## 2015-10-16 DIAGNOSIS — Z86010 Personal history of colonic polyps: Secondary | ICD-10-CM | POA: Diagnosis present

## 2015-10-16 DIAGNOSIS — D124 Benign neoplasm of descending colon: Secondary | ICD-10-CM

## 2015-10-16 MED ORDER — SODIUM CHLORIDE 0.9 % IV SOLN: 500.0000 mL | INTRAVENOUS | Status: DC

## 2015-10-16 NOTE — Progress Notes (Signed)
Report to PACU, RN, vss, BBS= Clear.  

## 2015-10-16 NOTE — Progress Notes (Signed)
Called to room to assist during endoscopic procedure.  Patient ID and intended procedure confirmed with present staff. Received instructions for my participation in the procedure from the performing physician.  

## 2015-10-16 NOTE — Op Note (Signed)
[place]  [address] [place], [id number]   COLONOSCOPY PROCEDURE REPORT  PATIENT: Catherine [name], Catherine [name]  MR#: [id number] BIRTHDATE: 1950/12/26 , 1  yrs. old GENDER: female ENDOSCOPIST: Gatha Mayer, MD, [facility] PROCEDURE DATE:  10/16/2015 PROCEDURE:   Colonoscopy, surveillance and Colonoscopy with snare polypectomy First Screening Colonoscopy - Avg.  risk and is 50 yrs.  old or older - No.  Prior Negative Screening - Now for repeat screening. N/A  History of Adenoma - Now for follow-up colonoscopy & has been > or = to 3 yrs.  Yes hx of adenoma.  Has been 3 or more years since last colonoscopy.  Polyps removed today? Yes ASA CLASS:   Class II INDICATIONS:Surveillance due to prior colonic neoplasia and PH Colon Adenoma. MEDICATIONS: Propofol 250 mg IV and Monitored anesthesia care  DESCRIPTION OF PROCEDURE:   After the risks benefits and alternatives of the procedure were thoroughly explained, informed consent was obtained.  The digital rectal exam revealed no abnormalities of the rectum.   The LB 1528  endoscope was introduced through the anus and advanced to the cecum, which was identified by both the appendix and ileocecal valve. No adverse events experienced.   The quality of the prep was good.  (MiraLax was used)  The instrument was then slowly withdrawn as the colon was fully examined. Estimated blood loss is zero unless otherwise noted in this procedure report.   COLON FINDINGS: Two flat polyps ranging from 12 to 43mm in size were found in the descending colon and at the cecum.  Polypectomies were performed in a piecemeal fashion with a cold snare.  The resection was incomplete but the polyp tissue was completely retrieved and sent to histology.   The examination was otherwise normal. Retroflexed views revealed no abnormalities. The time to cecum = 4.8 Withdrawal time = 19.1   The scope was withdrawn and the procedure completed. COMPLICATIONS: There were  no immediate complications.  ENDOSCOPIC IMPRESSION: 1.   Two flat polyps ranging from 12 to 64mm in size were found in the descending colon and at the cecum; polypectomies were performed in a piecemeal fashion with a cold snare 2.   The examination was otherwise normal - good prep - she has a hx of tubulovillous adenoma in 2007 and no polyps 2011.  RECOMMENDATIONS: Not sure the cecal polyp - if it is a polyp - was completely removed.  Very subtle lesion.  Could need close f/u with an APC available.  Await pathology.  eSigned:  Gatha Mayer, MD, [facility] 10/16/2015 12:09 PM   cc: The Patient

## 2015-10-16 NOTE — Progress Notes (Signed)
No egg or soy allergy known to patient  No issues with past sedation with any surgeries  or procedures, no intubation problems  No diet pills per patient No home 02 use per patient  No blood thinners per patient    

## 2015-10-16 NOTE — Patient Instructions (Addendum)
I found and removed 2 polyps. I will let you know pathology results and when to have another routine colonoscopy by mail.  I appreciate the opportunity to care for you. Gatha Mayer, MD, FACG   YOU HAD AN ENDOSCOPIC PROCEDURE TODAY AT [facility]:   Refer to the procedure report that was given to you for any specific questions about what was found during the examination.  If the procedure report does not answer your questions, please call your gastroenterologist to clarify.  If you requested that your care partner not be given the details of your procedure findings, then the procedure report has been included in a sealed envelope for you to review at your convenience later.  YOU SHOULD EXPECT: Some feelings of bloating in the abdomen. Passage of more gas than usual.  Walking can help get rid of the air that was put into your GI tract during the procedure and reduce the bloating. If you had a lower endoscopy (such as a colonoscopy or flexible sigmoidoscopy) you may notice spotting of blood in your stool or on the toilet paper. If you underwent a bowel prep for your procedure, you may not have a normal bowel movement for a few days.  Please Note:  You might notice some irritation and congestion in your nose or some drainage.  This is from the oxygen used during your procedure.  There is no need for concern and it should clear up in a day or so.  SYMPTOMS TO REPORT IMMEDIATELY:   Following lower endoscopy (colonoscopy or flexible sigmoidoscopy):  Excessive amounts of blood in the stool  Significant tenderness or worsening of abdominal pains  Swelling of the abdomen that is new, acute  Fever of 100F or higher    For urgent or emergent issues, a gastroenterologist can be reached at any hour by calling [phone].   DIET: Your first meal following the procedure should be a small meal and then it is ok to progress to your normal diet. Heavy or fried foods are  harder to digest and may make you feel nauseous or bloated.  Likewise, meals heavy in dairy and vegetables can increase bloating.  Drink plenty of fluids but you should avoid alcoholic beverages for 24 hours.  ACTIVITY:  You should plan to take it easy for the rest of today and you should NOT DRIVE or use heavy machinery until tomorrow (because of the sedation medicines used during the test).    FOLLOW UP: Our staff will call the number listed on your records the next business day following your procedure to check on you and address any questions or concerns that you may have regarding the information given to you following your procedure. If we do not reach you, we will leave a message.  However, if you are feeling well and you are not experiencing any problems, there is no need to return our call.  We will assume that you have returned to your regular daily activities without incident.  If any biopsies were taken you will be contacted by phone or by letter within the next 1-3 weeks.  Please call us at [phone] if you have not heard about the biopsies in 3 weeks.    SIGNATURES/CONFIDENTIALITY: You and/or your care partner have signed paperwork which will be entered into your electronic medical record.  These signatures attest to the fact that that the information above on your After Visit Summary has been reviewed and is understood.  Full responsibility of the confidentiality of this discharge information lies with you and/or your care-partner.   Information on polyps given to you today

## 2015-10-17 ENCOUNTER — Telehealth: Payer: Self-pay

## 2015-10-17 NOTE — Telephone Encounter (Signed)
  Follow up Call-  Call back number 10/16/2015  Post procedure Call Back phone  # [phone]  Permission to leave phone message Yes    Patient was called for follow up after procedure on 10/16/2015. No answer at the number given for follow up phone call. A message was left on the answering machine.

## 2015-10-18 ENCOUNTER — Telehealth: Payer: Self-pay | Admitting: Emergency Medicine

## 2015-10-18 NOTE — Telephone Encounter (Signed)
error 

## 2015-10-20 ENCOUNTER — Encounter: Payer: Self-pay | Admitting: Internal Medicine

## 2015-10-20 DIAGNOSIS — Z86010 Personal history of colonic polyps: Secondary | ICD-10-CM

## 2015-10-20 NOTE — Progress Notes (Signed)
Quick Note:  2 flat ssp's and cecal polyp likely not completely removed Will recall 4 months to do colonoscopy at hospital w/ APC ______

## 2016-02-09 ENCOUNTER — Encounter: Payer: Self-pay | Admitting: Internal Medicine

## 2016-04-03 ENCOUNTER — Other Ambulatory Visit: Payer: Self-pay

## 2016-04-03 ENCOUNTER — Telehealth: Payer: Self-pay

## 2016-04-03 DIAGNOSIS — Z860100 Personal history of colon polyps, unspecified: Secondary | ICD-10-CM

## 2016-04-03 NOTE — Telephone Encounter (Signed)
-----   Message from Grant Fontana, RN sent at 04/02/2016  2:20 PM EDT ----- Needs to be at hospital  Needs APC

## 2016-04-03 NOTE — Telephone Encounter (Signed)
Patient notified that procedure needs to be rescheudled to the hospital.  She is scheduled for 06/17/16.  She will come for pre-visit on 9/26

## 2016-04-03 NOTE — Telephone Encounter (Signed)
I left a message for the patient to call back to reschedule her procedure to the hospital .  Needs  APC available.

## 2016-04-10 ENCOUNTER — Telehealth: Payer: Self-pay | Admitting: Internal Medicine

## 2016-04-10 NOTE — Telephone Encounter (Signed)
Patient reports that she needs to sreschedule her procedure due to traveling on the day of her prep.  After discussion of prep instructions. She will be home in the evening when it is time to prep.  She will keep her appt as scheduled for now

## 2016-04-24 ENCOUNTER — Encounter: Payer: Medicare Other | Admitting: Internal Medicine

## 2016-05-28 ENCOUNTER — Ambulatory Visit (INDEPENDENT_AMBULATORY_CARE_PROVIDER_SITE_OTHER): Payer: Medicare Other | Admitting: General Practice

## 2016-05-28 DIAGNOSIS — Z23 Encounter for immunization: Secondary | ICD-10-CM

## 2016-05-30 ENCOUNTER — Ambulatory Visit (INDEPENDENT_AMBULATORY_CARE_PROVIDER_SITE_OTHER): Payer: Medicare Other

## 2016-05-30 DIAGNOSIS — Z23 Encounter for immunization: Secondary | ICD-10-CM

## 2016-05-30 LAB — TB SKIN TEST
INDURATION: 0 mm
TB Skin Test: NEGATIVE

## 2016-05-30 NOTE — Progress Notes (Signed)
Ppd placed

## 2016-05-31 ENCOUNTER — Ambulatory Visit (AMBULATORY_SURGERY_CENTER): Payer: Self-pay

## 2016-05-31 VITALS — Ht 67.5 in | Wt 141.4 lb

## 2016-05-31 DIAGNOSIS — Z860100 Personal history of colon polyps, unspecified: Secondary | ICD-10-CM

## 2016-05-31 NOTE — Progress Notes (Signed)
No allergies to eggs or soy No past problems with anesthesia No diet meds no home oxygen  Declined emmi 

## 2016-06-11 ENCOUNTER — Encounter (HOSPITAL_COMMUNITY): Payer: Self-pay | Admitting: *Deleted

## 2016-06-17 ENCOUNTER — Ambulatory Visit (HOSPITAL_COMMUNITY)
Admission: RE | Admit: 2016-06-17 | Discharge: 2016-06-17 | Disposition: A | Discharge status: Home / Self Care | Payer: Medicare Other | Source: Ambulatory Visit | Attending: Internal Medicine | Admitting: Internal Medicine

## 2016-06-17 ENCOUNTER — Encounter (HOSPITAL_COMMUNITY): Payer: Self-pay

## 2016-06-17 ENCOUNTER — Ambulatory Visit (HOSPITAL_COMMUNITY): Payer: Medicare Other | Admitting: Anesthesiology

## 2016-06-17 ENCOUNTER — Encounter (HOSPITAL_COMMUNITY)
Admission: RE | Disposition: A | Discharge status: Home / Self Care | Payer: Self-pay | Source: Ambulatory Visit | Attending: Internal Medicine

## 2016-06-17 DIAGNOSIS — Z86010 Personal history of colonic polyps: Secondary | ICD-10-CM

## 2016-06-17 DIAGNOSIS — Z801 Family history of malignant neoplasm of trachea, bronchus and lung: Secondary | ICD-10-CM | POA: Insufficient documentation

## 2016-06-17 DIAGNOSIS — M810 Age-related osteoporosis without current pathological fracture: Secondary | ICD-10-CM | POA: Diagnosis not present

## 2016-06-17 DIAGNOSIS — Z48815 Encounter for surgical aftercare following surgery on the digestive system: Secondary | ICD-10-CM | POA: Diagnosis present

## 2016-06-17 DIAGNOSIS — D120 Benign neoplasm of cecum: Secondary | ICD-10-CM

## 2016-06-17 HISTORY — PX: HOT HEMOSTASIS: SHX5433

## 2016-06-17 HISTORY — PX: COLONOSCOPY WITH PROPOFOL: SHX5780

## 2016-06-17 SURGERY — COLONOSCOPY WITH PROPOFOL
Anesthesia: Monitor Anesthesia Care

## 2016-06-17 MED ORDER — PROPOFOL 10 MG/ML IV BOLUS
INTRAVENOUS | Status: AC
  Filled 2016-06-17: qty 40

## 2016-06-17 MED ORDER — LACTATED RINGERS IV SOLN
INTRAVENOUS | Status: DC
  Administered 2016-06-17: 1000 mL via INTRAVENOUS
  Administered 2016-06-17: 09:00:00 via INTRAVENOUS

## 2016-06-17 MED ORDER — SODIUM CHLORIDE 0.9 % IV SOLN: INTRAVENOUS | Status: DC

## 2016-06-17 MED ORDER — ONDANSETRON HCL 4 MG/2ML IJ SOLN
INTRAMUSCULAR | Status: DC | PRN
  Administered 2016-06-17: 4 mg via INTRAVENOUS

## 2016-06-17 MED ORDER — PROPOFOL 10 MG/ML IV BOLUS
INTRAVENOUS | Status: DC | PRN
  Administered 2016-06-17 (×17): 20 mg via INTRAVENOUS

## 2016-06-17 SURGICAL SUPPLY — 22 items

## 2016-06-17 NOTE — Anesthesia Postprocedure Evaluation (Signed)
Anesthesia Post Note  Patient: Anicka Gowen  Procedure(s) Performed: Procedure(s) (LRB): COLONOSCOPY WITH PROPOFOL (N/A) HOT HEMOSTASIS (ARGON PLASMA COAGULATION/BICAP) (N/A)  Patient location during evaluation: PACU Anesthesia Type: MAC Level of consciousness: awake and alert Pain management: pain level controlled Vital Signs Assessment: post-procedure vital signs reviewed and stable Respiratory status: spontaneous breathing, nonlabored ventilation, respiratory function stable and patient connected to nasal cannula oxygen Cardiovascular status: stable and blood pressure returned to baseline Anesthetic complications: no    Last Vitals:  Vitals:   06/17/16 1020 06/17/16 1030  BP: (!) 116/50   Pulse: (!) 56 (!) 53  Resp: 13 15  Temp:      Last Pain:  Vitals:   06/17/16 1010  TempSrc: Oral                 Effie Berkshire

## 2016-06-17 NOTE — Anesthesia Preprocedure Evaluation (Addendum)
Anesthesia Evaluation  Patient identified by MRN, date of birth, ID band Patient awake    Reviewed: Allergy & Precautions, NPO status , Patient's Chart, lab work & pertinent test results  Airway Mallampati: II  TM Distance: >3 FB     Dental  (+) Teeth Intact, Dental Advisory Given   Pulmonary neg pulmonary ROS,    breath sounds clear to auscultation       Cardiovascular negative cardio ROS   Rhythm:Regular Rate:Normal     Neuro/Psych negative neurological ROS  negative psych ROS   GI/Hepatic negative GI ROS, Neg liver ROS,   Endo/Other  negative endocrine ROS  Renal/GU negative Renal ROS  negative genitourinary   Musculoskeletal negative musculoskeletal ROS (+)   Abdominal   Peds negative pediatric ROS (+)  Hematology negative hematology ROS (+)   Anesthesia Other Findings   Reproductive/Obstetrics negative OB ROS                            Anesthesia Physical Anesthesia Plan  ASA: II  Anesthesia Plan: MAC   Post-op Pain Management:    Induction: Intravenous  Airway Management Planned: Natural Airway  Additional Equipment:   Intra-op Plan:   Post-operative Plan:   Informed Consent: I have reviewed the patients History and Physical, chart, labs and discussed the procedure including the risks, benefits and alternatives for the proposed anesthesia with the patient or authorized representative who has indicated his/her understanding and acceptance.     Plan Discussed with: CRNA  Anesthesia Plan Comments:         Anesthesia Quick Evaluation

## 2016-06-17 NOTE — H&P (Signed)
[facility] History and Physical   Primary Care Physician:  Binnie Rail, MD   Reason for Procedure:   reassess/remove remaining colon polyp  Plan:    Colonoscopy with polypectomy     and APC     HPI: Catherine [name] is a 65 y.o. female s/p colonoscopy 10/2015 - flat cecal polyp thought to be partially removed. Polyp was a sessile serrated polyp.   Past Medical History:  Diagnosis Date  . Colon polyp   . Osteoporosis 08/2014   T score -2.6    Past Surgical History:  Procedure Laterality Date  . COLONOSCOPY    . NO PAST SURGERIES    . POLYPECTOMY      Prior to Admission medications   Medication Sig Start Date End Date Taking? Authorizing Provider  calcium gluconate 500 MG tablet Take 500 mg by mouth daily.   Yes Historical Provider, MD  Cholecalciferol (VITAMIN D) 1000 UNITS capsule Take 1,000 Units by mouth daily.     Yes Historical Provider, MD  clobetasol cream (TEMOVATE) 0.05 % Apply as needed for irritation 08/25/13  Yes Timothy P Fontaine, MD  CRANBERRY PO Take 300 mg by mouth.   Yes Historical Provider, MD  glucosamine-chondroitin 500-400 MG tablet Take 1 tablet by mouth daily.     Yes Historical Provider, MD  Multiple Vitamin (MULTIVITAMIN) capsule Take 1 capsule by mouth daily.     Yes Historical Provider, MD  nystatin-triamcinolone ointment (MYCOLOG) Apply 1 application topically 2 (two) times daily. Patient taking differently: Apply 1 application topically as needed.  08/29/15  Yes Anastasio Auerbach, MD  Omega-3 Fatty Acids (FISH OIL) 1000 MG CAPS Take 1 capsule by mouth daily.     Yes Historical Provider, MD  TURMERIC PO Take 1,000 mg by mouth daily.   Yes Historical Provider, MD    Current Facility-Administered Medications  Medication Dose Route Frequency Provider Last Rate Last Dose  . 0.9 %  sodium chloride infusion   Intravenous Continuous Gatha Mayer, MD      . lactated ringers infusion   Intravenous Continuous Gatha Mayer, MD 50 mL/hr  at 06/17/16 0809 1,000 mL at 06/17/16 0809    Allergies as of 04/03/2016 - Review Complete 10/16/2015  Allergen Reaction Noted  . Avocado Nausea And Vomiting   . Oysters [shellfish allergy] Nausea And Vomiting     Family History  Problem Relation Age of Onset  . Hypothyroidism Mother   . Thyroid disease Mother   . Lung cancer Father     smoker  . Thyroid disease Sister   . Thyroid disease Brother   . Thyroid disease Sister   . Colon cancer Neg Hx   . Colon polyps Neg Hx   . Esophageal cancer Neg Hx   . Rectal cancer Neg Hx   . Stomach cancer Neg Hx     Social History   Social History  . Marital status: Married    Spouse name: N/A  . Number of children: N/A  . Years of education: N/A   Occupational History  . retired    Social History Main Topics  . Smoking status: Never Smoker  . Smokeless tobacco: Never Used  . Alcohol use 0.0 oz/week     Comment: wine with dinner  . Drug use: No  . Sexual activity: Yes    Birth control/ protection: Post-menopausal     Comment: 1st intercourse 65 yo-Fewer than 5 partners   Other Topics Concern  . Not on file  Social History Narrative   Exercises regularly    Review of Systems:  All other review of systems negative except as mentioned in the HPI.  Physical Exam: Vital signs in last 24 hours: Temp:  [97.8 F (36.6 C)] 97.8 F (36.6 C) (10/09 0753) Pulse Rate:  [57] 57 (10/09 0753) Resp:  [14] 14 (10/09 0753) BP: (119)/(62) 119/62 (10/09 0753) SpO2:  [100 %] 100 % (10/09 0753)   General:   Alert,  Well-developed, well-nourished, pleasant and cooperative in NAD Lungs:  Clear throughout to auscultation.   Heart:  Regular rate and rhythm; no murmurs, clicks, rubs,  or gallops. Abdomen:  Soft, nontender and nondistended. Normal bowel sounds.   Neuro/Psych:  Alert and cooperative. Normal mood and affect. A and O x 3   @Raylon Lamson  Simonne Maffucci, MD, [facility] [phone] (pager) 06/17/2016 8:53  AM@

## 2016-06-17 NOTE — Transfer of Care (Signed)
Immediate Anesthesia Transfer of Care Note  Patient: Catherine [name]  Procedure(s) Performed: Procedure(s): COLONOSCOPY WITH PROPOFOL (N/A) HOT HEMOSTASIS (ARGON PLASMA COAGULATION/BICAP) (N/A)  Patient Location: PACU and Endoscopy Unit  Anesthesia Type:MAC  Level of Consciousness: awake  Airway & Oxygen Therapy: Patient Spontanous Breathing and Patient connected to face mask oxygen  Post-op Assessment: Report given to RN and Post -op Vital signs reviewed and stable  Post vital signs: Reviewed and stable  Last Vitals:  Vitals:   06/17/16 0753  BP: 119/62  Pulse: (!) 57  Resp: 14  Temp: 36.6 C    Last Pain:  Vitals:   06/17/16 0753  TempSrc: Oral         Complications: No apparent anesthesia complications

## 2016-06-17 NOTE — Op Note (Signed)
[facility] Patient Name: Catherine [name] Procedure Date: 06/17/2016 MRN: [id number] Attending MD: Gatha Mayer , MD Date of Birth: [birth year] CSN: [id number] Age: 65 Admit Type: Inpatient Procedure:                Colonoscopy Indications:              High risk colon cancer surveillance: Personal                            history of sessile serrated colon polyp (10 mm or                            greater in size), Flat cecal ssp thought only                            partially removed 10/2015 Providers:                Gatha Mayer, MD, Hilma Favors, RN, Elspeth Cho Tech., Technician, Glenis Smoker, CRNA Referring MD:              Medicines:                Propofol per Anesthesia, Monitored Anesthesia Care Complications:            No immediate complications. Estimated Blood Loss:     Estimated blood loss was minimal. Procedure:                Pre-Anesthesia Assessment:                           - Prior to the procedure, a History and Physical                            was performed, and patient medications and                            allergies were reviewed. The patient's tolerance of                            previous anesthesia was also reviewed. The risks                            and benefits of the procedure and the sedation                            options and risks were discussed with the patient.                            All questions were answered, and informed consent                            was obtained. Prior Anticoagulants: The patient has  taken no previous anticoagulant or antiplatelet                            agents. ASA Grade Assessment: II - A patient with                            mild systemic disease. After reviewing the risks                            and benefits, the patient was deemed in                            satisfactory condition to undergo the procedure.                         After obtaining informed consent, the colonoscope                            was passed under direct vision. Throughout the                            procedure, the patient's blood pressure, pulse, and                            oxygen saturations were monitored continuously. The                            [id number]) scope was introduced through                            the anus and advanced to the the cecum, identified                            by appendiceal orifice and ileocecal valve. The                            colonoscopy was performed without difficulty. The                            patient tolerated the procedure well. The quality                            of the bowel preparation was good. The ileocecal                            valve, appendiceal orifice, and rectum were                            photographed. Scope In: 9:30:12 AM Scope Out: 9:55:31 AM Scope Withdrawal Time: 0 hours 18 minutes 36 seconds  Total Procedure Duration: 0 hours 25 minutes 19 seconds  Findings:      The perianal and digital rectal examinations were normal.      A small post polypectomy scar was found in the cecum. Heaped up folds  with ? of residual polyp were removed with cold snare and residual       tissue snared areas were treated and ablated with APC at right colon       settings.      The exam was otherwise without abnormality on direct and retroflexion       views. Impression:               - Post-polypectomy scar in the cecum. ? residual                            flat polyp tissue snared and ablated as above.                           - The examination was otherwise normal on direct                            and retroflexion views.                           - No specimens collected. Moderate Sedation:      Please see anesthesia notes, moderate sedation not given Recommendation:           - Patient has a contact number available for                             emergencies. The signs and symptoms of potential                            delayed complications were discussed with the                            patient. Return to normal activities tomorrow.                            Written discharge instructions were provided to the                            patient.                           - Resume previous diet.                           - No aspirin, ibuprofen, naproxen, or other                            non-steroidal anti-inflammatory drugs for 2 weeks                            after polyp removal.                           - Repeat colonoscopy is recommended for                            surveillance. The colonoscopy date will be  determined after pathology results from today's                            exam become available for review. Procedure Code(s):        --- Professional ---                           [phone], Colonoscopy, flexible; with ablation of                            tumor(s), polyp(s), or other lesion(s) (includes                            pre- and post-dilation and guide wire passage, when                            performed)                           45385, 59, Colonoscopy, flexible; with removal of                            tumor(s), polyp(s), or other lesion(s) by snare                            technique Diagnosis Code(s):        --- Professional ---                           D12.0, Benign neoplasm of cecum                           Z98.890, Other specified postprocedural states                           Z86.010, Personal history of colonic polyps CPT copyright 2016 [employer] Association. All rights reserved. The codes documented in this report are preliminary and upon coder review may  be revised to meet current compliance requirements. Gatha Mayer, MD 06/17/2016 10:20:18 AM This report has been signed electronically. Number of Addenda: 0

## 2016-06-17 NOTE — Discharge Instructions (Addendum)
° °  I saw where the polyp was removed and took more tissue and ablated the area. Will let you know results and plans. No aspirin, anti-inflammatories (ibuprofen, aleve, etc) for next 2 weeks. Acetaminophen is ok.  I appreciate the opportunity to care for you. Gatha Mayer, MD, FACG  YOU HAD AN ENDOSCOPIC PROCEDURE TODAY: Refer to the procedure report and other information in the discharge instructions given to you for any specific questions about what was found during the examination. If this information does not answer your questions, please call Dr. Celesta Aver office at [phone] to clarify.   YOU SHOULD EXPECT: Some feelings of bloating in the abdomen. Passage of more gas than usual. Walking can help get rid of the air that was put into your GI tract during the procedure and reduce the bloating. If you had a lower endoscopy (such as a colonoscopy or flexible sigmoidoscopy) you may notice spotting of blood in your stool or on the toilet paper. Some abdominal soreness may be present for a day or two, also.  DIET: Your first meal following the procedure should be a light meal and then it is ok to progress to your normal diet. A half-sandwich or bowl of soup is an example of a good first meal. Heavy or fried foods are harder to digest and may make you feel nauseous or bloated. Drink plenty of fluids but you should avoid alcoholic beverages for 24 hours.   ACTIVITY: Your care partner should take you home directly after the procedure. You should plan to take it easy, moving slowly for the rest of the day. You can resume normal activity the day after the procedure however YOU SHOULD NOT DRIVE, use power tools, machinery or perform tasks that involve climbing or major physical exertion for 24 hours (because of the sedation medicines used during the test).   SYMPTOMS TO REPORT IMMEDIATELY: A gastroenterologist can be reached at any hour. Please call [phone]  for any of the following  symptoms:  Following lower endoscopy (colonoscopy, flexible sigmoidoscopy) Excessive amounts of blood in the stool  Significant tenderness, worsening of abdominal pains  Swelling of the abdomen that is new, acute  Fever of 100 or higher  Following upper endoscopy (EGD, EUS, ERCP, esophageal dilation) Vomiting of blood or coffee ground material  New, significant abdominal pain  New, significant chest pain or pain under the shoulder blades  Painful or persistently difficult swallowing  New shortness of breath  Black, tarry-looking or red, bloody stools  FOLLOW UP:  If any biopsies were taken you will be contacted by phone or by letter within the next 1-3 weeks. Call [phone]  if you have not heard about the biopsies in 3 weeks.  Please also call with any specific questions about appointments or follow up tests.

## 2016-06-18 ENCOUNTER — Encounter (HOSPITAL_COMMUNITY): Payer: Self-pay | Admitting: Internal Medicine

## 2016-06-18 NOTE — Progress Notes (Signed)
Residual ssp - snared and ablated Repeat colonoscopy 1 year APC available My Chart notice to patient

## 2016-08-23 LAB — HM MAMMOGRAPHY

## 2016-08-28 ENCOUNTER — Ambulatory Visit (INDEPENDENT_AMBULATORY_CARE_PROVIDER_SITE_OTHER): Payer: Medicare Other | Admitting: Internal Medicine

## 2016-08-28 ENCOUNTER — Encounter: Payer: Self-pay | Admitting: Internal Medicine

## 2016-08-28 VITALS — BP 102/66 | HR 65 | Temp 98.3°F | Resp 16 | Ht 68.0 in | Wt 137.0 lb

## 2016-08-28 DIAGNOSIS — Z0000 Encounter for general adult medical examination without abnormal findings: Secondary | ICD-10-CM | POA: Diagnosis not present

## 2016-08-28 DIAGNOSIS — E7800 Pure hypercholesterolemia, unspecified: Secondary | ICD-10-CM

## 2016-08-28 DIAGNOSIS — M810 Age-related osteoporosis without current pathological fracture: Secondary | ICD-10-CM

## 2016-08-28 NOTE — Assessment & Plan Note (Signed)
Check lipid panel Continue healthy diet and regular exercise  

## 2016-08-28 NOTE — Progress Notes (Signed)
Subjective:    Patient ID: Catherine [name], female    DOB: [birth year], 65 y.o.   MRN: [id number]  HPI Here for medicare wellness exam and annual physical exam.   I have personally reviewed and have noted 1.The patient's medical and social history 2.Their use of alcohol, tobacco or illicit drugs 3.Their current medications and supplements 4.The patient's functional ability including ADL's, fall risks, home safety risks and hearing or visual impairment. 5.Diet and physical activities 6.Evidence for depression or mood disorders 7.Care team reviewed  - Dr Phineas Real - gyn, eye doctor, derm annually   Are there smokers in your home (other than you)? No  Risk Factors Exercise: regular - weights, cardio 3/week, 10000 steps a day Dietary issues discussed:  Well balanced, low salt diet uses some butter  Cardiac risk factors: advanced age    Depression Screen  Have you felt down, depressed or hopeless? No  Have you felt little interest or pleasure in doing things?  No  Activities of Daily Living In your present state of health, do you have any difficulty performing the following activities?:  Driving? No Managing money?  No Feeding yourself? No Getting from bed to chair? No Climbing a flight of stairs? No Preparing food and eating?: No Bathing or showering? No Getting dressed: No Getting to/using the toilet? No Moving around from place to place: No In the past year have you fallen or had a near fall?: No   Are you sexually active?  yes  Do you have more than one partner?  no  Hearing Difficulties:  Do you often ask people to speak up or repeat themselves? No Do you experience ringing or noises in your ears? Yes, mild at times Do you have difficulty understanding soft or whispered voices? Yes, mild right  Vision:              Any change in vision:  No              Up to date with eye exam:  Yes  Memory:    Do  you feel that you have a problem with memory? No  Do you often misplace items? No  Do you feel safe at home?  Yes  Cognitive Testing  Alert, Orientated? Yes  Normal Appearance? Yes  Recall of three objects?  Yes  Can perform simple calculations? Yes  Displays appropriate judgment? Yes  Can read the correct time from a watch face? Yes   Advanced Directives have been discussed with the patient? Yes   Medications and allergies reviewed with patient and updated if appropriate.  Patient Active Problem List   Diagnosis Date Noted  . Osteoporosis 08/16/2015  . Hx of colonic polyps 07/04/2012  . HYPERCHOLESTEROLEMIA 05/06/2008    Current Outpatient Prescriptions on File Prior to Visit  Medication Sig Dispense Refill  . calcium gluconate 500 MG tablet Take 500 mg by mouth daily.    . Cholecalciferol (VITAMIN D) 1000 UNITS capsule Take 1,000 Units by mouth daily.      Marland Kitchen CRANBERRY PO Take 300 mg by mouth.    Marland Kitchen glucosamine-chondroitin 500-400 MG tablet Take 1 tablet by mouth daily.      . Multiple Vitamin (MULTIVITAMIN) capsule Take 1 capsule by mouth daily.      . Omega-3 Fatty Acids (FISH OIL) 1000 MG CAPS Take 1 capsule by mouth daily.      . TURMERIC PO Take 1,000 mg by mouth daily.     No current facility-administered  medications on file prior to visit.     Past Medical History:  Diagnosis Date  . Colon polyp   . Osteoporosis 08/2014   T score -2.6    Past Surgical History:  Procedure Laterality Date  . COLONOSCOPY    . COLONOSCOPY WITH PROPOFOL N/A 06/17/2016   Procedure: COLONOSCOPY WITH PROPOFOL;  Surgeon: Gatha Mayer, MD;  Location: [facility];  Service: Endoscopy;  Laterality: N/A;  . HOT HEMOSTASIS N/A 06/17/2016   Procedure: HOT HEMOSTASIS (ARGON PLASMA COAGULATION/BICAP);  Surgeon: Gatha Mayer, MD;  Location: [facility];  Service: Endoscopy;  Laterality: N/A;  . NO PAST SURGERIES    . POLYPECTOMY      Social History   Social History  . Marital status:  Married    Spouse name: N/A  . Number of children: N/A  . Years of education: N/A   Occupational History  . retired    Social History Main Topics  . Smoking status: Never Smoker  . Smokeless tobacco: Never Used  . Alcohol use 0.0 oz/week     Comment: wine with dinner  . Drug use: No  . Sexual activity: Yes    Birth control/ protection: Post-menopausal     Comment: 1st intercourse 65 yo-Fewer than 5 partners   Other Topics Concern  . None   Social History Narrative   Exercises regularly    Family History  Problem Relation Age of Onset  . Hypothyroidism Mother   . Thyroid disease Mother   . Lung cancer Father     smoker  . Thyroid disease Sister   . Thyroid disease Brother   . Thyroid disease Sister   . Colon cancer Neg Hx   . Colon polyps Neg Hx   . Esophageal cancer Neg Hx   . Rectal cancer Neg Hx   . Stomach cancer Neg Hx     Review of Systems  Constitutional: Negative for chills and fever.  HENT: Positive for tinnitus (mild, intermittent). Negative for hearing loss.   Eyes: Negative for visual disturbance.  Respiratory: Negative for cough, shortness of breath and wheezing.   Cardiovascular: Negative for chest pain, palpitations and leg swelling.  Gastrointestinal: Negative for abdominal pain, blood in stool, constipation, diarrhea and nausea.       No gerd  Genitourinary: Negative for dysuria and hematuria.  Musculoskeletal: Negative for arthralgias and back pain.  Skin: Negative for color change and rash.  Neurological: Negative for light-headedness and headaches.  Psychiatric/Behavioral: Negative for dysphoric mood. The patient is not nervous/anxious.        Objective:   Vitals:   08/28/16 1534  BP: 102/66  Pulse: 65  Resp: 16  Temp: 98.3 F (36.8 C)   Filed Weights   08/28/16 1534  Weight: 137 lb (62.1 kg)   Body mass index is 20.83 kg/m.   Physical Exam Constitutional: She appears well-developed and well-nourished. No distress.  HENT:    Head: Normocephalic and atraumatic.  Right Ear: External ear normal. Normal ear canal and TM Left Ear: External ear normal.  Normal ear canal and TM Mouth/Throat: Oropharynx is clear and moist.  Eyes: Conjunctivae and EOM are normal.  Neck: Neck supple. No tracheal deviation present. No thyromegaly present.  No carotid bruit  Cardiovascular: Normal rate, regular rhythm and normal heart sounds.   No murmur heard.  No edema. Pulmonary/Chest: Effort normal and breath sounds normal. No respiratory distress. She has no wheezes. She has no rales.  Breast: deferred to Gyn Abdominal: Soft.  She exhibits no distension. There is no tenderness.  Lymphadenopathy: She has no cervical adenopathy.  Skin: Skin is warm and dry. She is not diaphoretic.  Psychiatric: She has a normal mood and affect. Her behavior is normal.         Assessment & Plan:   Wellness Exam: Immunizations   Pneumovax given today Colonoscopy   Up to date  Mammogram   Up to date  Dexa - done last week - report pending Gyn    Up to date  Eye exam  Up to date  Hearing loss - mild on right - not significant Memory concerns/difficulties none Independent of ADLs  fully Stressed the importance of regular exercise   Patient received copy of preventative screening tests/immunizations recommended for the next 5-10 years.    Physical exam: Screening blood work  ordered Immunizations   Pneumovax given today Colonoscopy   Up to date  Mammogram   Up to date  Gyn  Up to date  Dexa    - done last week - report pending Eye exams  Up to date  Exercise - regular Weight - normal BMI Skin- sees derm annually, no concerns today  Substance abuse - none  See Problem List for Assessment and Plan of chronic medical problems.  F/u annually for wellness/cpe

## 2016-08-28 NOTE — Patient Instructions (Addendum)
Catherine [name] , Thank you for taking time to come for your Medicare Wellness Visit. I appreciate your ongoing commitment to your health goals. Please review the following plan we discussed and let me know if I can assist you in the future.   These are the goals we discussed: Goals    None      This is a list of the screening recommended for you and due dates:  Health Maintenance  Topic Date Due  . DEXA scan (bone density measurement)  Done   . Pneumonia vaccines (2 of 2 - PPSV23) 08/15/2016  . Mammogram  08/20/2017  . Tetanus Vaccine  01/28/2021  . Colon Cancer Screening  06/17/2026  . Flu Shot  Completed  . Shingles Vaccine  Completed  .  Hepatitis C: One time screening is recommended by Center for Disease Control  (CDC) for  adults born from [birth year] through 1966.   Completed     Test(s) ordered today. Your results will be released to [place] (or called to you) after review, usually within 72hours after test completion. If any changes need to be made, you will be notified at that same time.  All other Health Maintenance issues reviewed.   All recommended immunizations and age-appropriate screenings are up-to-date or discussed.  No immunizations administered today.   Medications reviewed and updated.  No changes recommended at this time.   Please followup in one year for a wellness visit    Health Maintenance, Female Introduction Adopting a healthy lifestyle and getting preventive care can go a long way to promote health and wellness. Talk with your health care provider about what schedule of regular examinations is right for you. This is a good chance for you to check in with your provider about disease prevention and staying healthy. In between checkups, there are plenty of things you can do on your own. Experts have done a lot of research about which lifestyle changes and preventive measures are most likely to keep you healthy. Ask your health care provider for more  information. Weight and diet Eat a healthy diet  Be sure to include plenty of vegetables, fruits, low-fat dairy products, and lean protein.  Do not eat a lot of foods high in solid fats, added sugars, or salt.  Get regular exercise. This is one of the most important things you can do for your health.  Most adults should exercise for at least 150 minutes each week. The exercise should increase your heart rate and make you sweat (moderate-intensity exercise).  Most adults should also do strengthening exercises at least twice a week. This is in addition to the moderate-intensity exercise. Maintain a healthy weight  Body mass index (BMI) is a measurement that can be used to identify possible weight problems. It estimates body fat based on height and weight. Your health care provider can help determine your BMI and help you achieve or maintain a healthy weight.  For females 45 years of age and older:  A BMI below 18.5 is considered underweight.  A BMI of 18.5 to 24.9 is normal.  A BMI of 25 to 29.9 is considered overweight.  A BMI of 30 and above is considered obese. Watch levels of cholesterol and blood lipids  You should start having your blood tested for lipids and cholesterol at 65 years of age, then have this test every 5 years.  You may need to have your cholesterol levels checked more often if:  Your lipid or cholesterol levels are high.  You are older than 66 years of age.  You are at high risk for heart disease. Cancer screening Lung Cancer  Lung cancer screening is recommended for adults 21-77 years old who are at high risk for lung cancer because of a history of smoking.  A yearly low-dose CT scan of the lungs is recommended for people who:  Currently smoke.  Have quit within the past 15 years.  Have at least a 30-pack-year history of smoking. A pack year is smoking an average of one pack of cigarettes a day for 1 year.  Yearly screening should continue until  it has been 15 years since you quit.  Yearly screening should stop if you develop a health problem that would prevent you from having lung cancer treatment. Breast Cancer  Practice breast self-awareness. This means understanding how your breasts normally appear and feel.  It also means doing regular breast self-exams. Let your health care provider know about any changes, no matter how small.  If you are in your 20s or 30s, you should have a clinical breast exam (CBE) by a health care provider every 1-3 years as part of a regular health exam.  If you are 57 or older, have a CBE every year. Also consider having a breast X-ray (mammogram) every year.  If you have a family history of breast cancer, talk to your health care provider about genetic screening.  If you are at high risk for breast cancer, talk to your health care provider about having an MRI and a mammogram every year.  Breast cancer gene (BRCA) assessment is recommended for women who have family members with BRCA-related cancers. BRCA-related cancers include:  Breast.  Ovarian.  Tubal.  Peritoneal cancers.  Results of the assessment will determine the need for genetic counseling and BRCA1 and BRCA2 testing. Cervical Cancer  Your health care provider may recommend that you be screened regularly for cancer of the pelvic organs (ovaries, uterus, and vagina). This screening involves a pelvic examination, including checking for microscopic changes to the surface of your cervix (Pap test). You may be encouraged to have this screening done every 3 years, beginning at age 60.  For women ages 28-65, health care providers may recommend pelvic exams and Pap testing every 3 years, or they may recommend the Pap and pelvic exam, combined with testing for human papilloma virus (HPV), every 5 years. Some types of HPV increase your risk of cervical cancer. Testing for HPV may also be done on women of any age with unclear Pap test  results.  Other health care providers may not recommend any screening for nonpregnant women who are considered low risk for pelvic cancer and who do not have symptoms. Ask your health care provider if a screening pelvic exam is right for you.  If you have had past treatment for cervical cancer or a condition that could lead to cancer, you need Pap tests and screening for cancer for at least 20 years after your treatment. If Pap tests have been discontinued, your risk factors (such as having a new sexual partner) need to be reassessed to determine if screening should resume. Some women have medical problems that increase the chance of getting cervical cancer. In these cases, your health care provider may recommend more frequent screening and Pap tests. Colorectal Cancer  This type of cancer can be detected and often prevented.  Routine colorectal cancer screening usually begins at 66 years of age and continues through 66 years of age.  Your  health care provider may recommend screening at an earlier age if you have risk factors for colon cancer.  Your health care provider may also recommend using home test kits to check for hidden blood in the stool.  A small camera at the end of a tube can be used to examine your colon directly (sigmoidoscopy or colonoscopy). This is done to check for the earliest forms of colorectal cancer.  Routine screening usually begins at age 10.  Direct examination of the colon should be repeated every 5-10 years through 66 years of age. However, you may need to be screened more often if early forms of precancerous polyps or small growths are found. Skin Cancer  Check your skin from head to toe regularly.  Tell your health care provider about any new moles or changes in moles, especially if there is a change in a mole's shape or color.  Also tell your health care provider if you have a mole that is larger than the size of a pencil eraser.  Always use sunscreen.  Apply sunscreen liberally and repeatedly throughout the day.  Protect yourself by wearing long sleeves, pants, a wide-brimmed hat, and sunglasses whenever you are outside. Heart disease, diabetes, and high blood pressure  High blood pressure causes heart disease and increases the risk of stroke. High blood pressure is more likely to develop in:  People who have blood pressure in the high end of the normal range (130-139/85-89 mm Hg).  People who are overweight or obese.  People who are African American.  If you are 3-70 years of age, have your blood pressure checked every 3-5 years. If you are 28 years of age or older, have your blood pressure checked every year. You should have your blood pressure measured twice-once when you are at a hospital or clinic, and once when you are not at a hospital or clinic. Record the average of the two measurements. To check your blood pressure when you are not at a hospital or clinic, you can use:  An automated blood pressure machine at a pharmacy.  A home blood pressure monitor.  If you are between 3 years and 51 years old, ask your health care provider if you should take aspirin to prevent strokes.  Have regular diabetes screenings. This involves taking a blood sample to check your fasting blood sugar level.  If you are at a normal weight and have a low risk for diabetes, have this test once every three years after 66 years of age.  If you are overweight and have a high risk for diabetes, consider being tested at a younger age or more often. Preventing infection Hepatitis B  If you have a higher risk for hepatitis B, you should be screened for this virus. You are considered at high risk for hepatitis B if:  You were born in a country where hepatitis B is common. Ask your health care provider which countries are considered high risk.  Your parents were born in a high-risk country, and you have not been immunized against hepatitis B (hepatitis B  vaccine).  You have HIV or AIDS.  You use needles to inject street drugs.  You live with someone who has hepatitis B.  You have had sex with someone who has hepatitis B.  You get hemodialysis treatment.  You take certain medicines for conditions, including cancer, organ transplantation, and autoimmune conditions. Hepatitis C  Blood testing is recommended for:  Everyone born from [birth year] through 1966.  Anyone with  known risk factors for hepatitis C. Sexually transmitted infections (STIs)  You should be screened for sexually transmitted infections (STIs) including gonorrhea and chlamydia if:  You are sexually active and are younger than 66 years of age.  You are older than 66 years of age and your health care provider tells you that you are at risk for this type of infection.  Your sexual activity has changed since you were last screened and you are at an increased risk for chlamydia or gonorrhea. Ask your health care provider if you are at risk.  If you do not have HIV, but are at risk, it may be recommended that you take a prescription medicine daily to prevent HIV infection. This is called pre-exposure prophylaxis (PrEP). You are considered at risk if:  You are sexually active and do not regularly use condoms or know the HIV status of your partner(s).  You take drugs by injection.  You are sexually active with a partner who has HIV. Talk with your health care provider about whether you are at high risk of being infected with HIV. If you choose to begin PrEP, you should first be tested for HIV. You should then be tested every 3 months for as long as you are taking PrEP. Pregnancy  If you are premenopausal and you may become pregnant, ask your health care provider about preconception counseling.  If you may become pregnant, take 400 to 800 micrograms (mcg) of folic acid every day.  If you want to prevent pregnancy, talk to your health care provider about birth control  (contraception). Osteoporosis and menopause  Osteoporosis is a disease in which the bones lose minerals and strength with aging. This can result in serious bone fractures. Your risk for osteoporosis can be identified using a bone density scan.  If you are 28 years of age or older, or if you are at risk for osteoporosis and fractures, ask your health care provider if you should be screened.  Ask your health care provider whether you should take a calcium or vitamin D supplement to lower your risk for osteoporosis.  Menopause may have certain physical symptoms and risks.  Hormone replacement therapy may reduce some of these symptoms and risks. Talk to your health care provider about whether hormone replacement therapy is right for you. Follow these instructions at home:  Schedule regular health, dental, and eye exams.  Stay current with your immunizations.  Do not use any tobacco products including cigarettes, chewing tobacco, or electronic cigarettes.  If you are pregnant, do not drink alcohol.  If you are breastfeeding, limit how much and how often you drink alcohol.  Limit alcohol intake to no more than 1 drink per day for nonpregnant women. One drink equals 12 ounces of beer, 5 ounces of wine, or 1 ounces of hard liquor.  Do not use street drugs.  Do not share needles.  Ask your health care provider for help if you need support or information about quitting drugs.  Tell your health care provider if you often feel depressed.  Tell your health care provider if you have ever been abused or do not feel safe at home. This information is not intended to replace advice given to you by your health care provider. Make sure you discuss any questions you have with your health care provider. Document Released: 03/11/2011 Document Revised: 02/01/2016 Document Reviewed: 05/30/2015  2017 [employer]vier

## 2016-08-28 NOTE — Assessment & Plan Note (Addendum)
Taking 1000 mg of calcium daily and 1000 units of vitamin D daily Exercises regularly  -weights 2/week, cardio 3/week - spinning twice, treadmill once Per patient there has been a decrease in density - has appt next week with Dr Phineas Real  Increase walking Advised to consider treatment  Will review report when available

## 2016-08-29 ENCOUNTER — Encounter: Payer: Self-pay | Admitting: Gynecology

## 2016-08-29 ENCOUNTER — Ambulatory Visit (INDEPENDENT_AMBULATORY_CARE_PROVIDER_SITE_OTHER): Payer: Medicare Other | Admitting: Gynecology

## 2016-08-29 ENCOUNTER — Encounter: Payer: Self-pay | Admitting: Internal Medicine

## 2016-08-29 ENCOUNTER — Other Ambulatory Visit (INDEPENDENT_AMBULATORY_CARE_PROVIDER_SITE_OTHER): Payer: Medicare Other

## 2016-08-29 ENCOUNTER — Other Ambulatory Visit: Payer: Self-pay | Admitting: Internal Medicine

## 2016-08-29 VITALS — BP 116/70 | Ht 66.0 in | Wt 137.0 lb

## 2016-08-29 DIAGNOSIS — Z0000 Encounter for general adult medical examination without abnormal findings: Secondary | ICD-10-CM | POA: Diagnosis not present

## 2016-08-29 DIAGNOSIS — Z01411 Encounter for gynecological examination (general) (routine) with abnormal findings: Secondary | ICD-10-CM | POA: Diagnosis not present

## 2016-08-29 DIAGNOSIS — R7989 Other specified abnormal findings of blood chemistry: Secondary | ICD-10-CM

## 2016-08-29 DIAGNOSIS — M810 Age-related osteoporosis without current pathological fracture: Secondary | ICD-10-CM

## 2016-08-29 DIAGNOSIS — N952 Postmenopausal atrophic vaginitis: Secondary | ICD-10-CM

## 2016-08-29 DIAGNOSIS — E7800 Pure hypercholesterolemia, unspecified: Secondary | ICD-10-CM

## 2016-08-29 LAB — COMPREHENSIVE METABOLIC PANEL
ALT: 12 U/L (ref 0–35)
AST: 19 U/L (ref 0–37)
Albumin: 4.4 g/dL (ref 3.5–5.2)
Alkaline Phosphatase: 67 U/L (ref 39–117)
BUN: 17 mg/dL (ref 6–23)
CHLORIDE: 104 meq/L (ref 96–112)
CO2: 30 meq/L (ref 19–32)
Calcium: 9.6 mg/dL (ref 8.4–10.5)
Creatinine, Ser: 0.9 mg/dL (ref 0.40–1.20)
GFR: 66.4 mL/min (ref >60.00)
GLUCOSE: 88 mg/dL (ref 70–99)
POTASSIUM: 4 meq/L (ref 3.5–5.1)
Sodium: 141 mEq/L (ref 135–145)
Total Bilirubin: 0.6 mg/dL (ref 0.2–1.2)
Total Protein: 7.4 g/dL (ref 6.0–8.3)

## 2016-08-29 LAB — CBC WITH DIFFERENTIAL/PLATELET
BASOS ABS: 0 10*3/uL (ref 0.0–0.1)
Basophils Relative: 0.3 % (ref 0.0–3.0)
EOS PCT: 1.4 % (ref 0.0–5.0)
Eosinophils Absolute: 0.1 10*3/uL (ref 0.0–0.7)
HCT: 38 % (ref 36.0–46.0)
Hemoglobin: 13 g/dL (ref 12.0–15.0)
LYMPHS ABS: 1.7 10*3/uL (ref 0.7–4.0)
Lymphocytes Relative: 33.1 % (ref 12.0–46.0)
MCHC: 34.1 g/dL (ref 30.0–36.0)
MCV: 95.5 fl (ref 78.0–100.0)
MONO ABS: 0.4 10*3/uL (ref 0.1–1.0)
MONOS PCT: 7.6 % (ref 3.0–12.0)
NEUTROS ABS: 3 10*3/uL (ref 1.4–7.7)
NEUTROS PCT: 57.6 % (ref 43.0–77.0)
PLATELETS: 255 10*3/uL (ref 150.0–400.0)
RBC: 3.98 Mil/uL (ref 3.87–5.11)
RDW: 13.7 % (ref 11.5–15.5)
WBC: 5.3 10*3/uL (ref 4.0–10.5)

## 2016-08-29 LAB — LIPID PANEL
CHOLESTEROL: 180 mg/dL (ref 0–200)
HDL: 71.4 mg/dL (ref >39.00)
LDL CALC: 98 mg/dL (ref 0–99)
NonHDL: 108.17
TRIGLYCERIDES: 53 mg/dL (ref 0.0–149.0)
Total CHOL/HDL Ratio: 3
VLDL: 10.6 mg/dL (ref 0.0–40.0)

## 2016-08-29 LAB — VITAMIN D 25 HYDROXY (VIT D DEFICIENCY, FRACTURES): VITD: 60.63 ng/mL (ref 30.00–100.00)

## 2016-08-29 LAB — TSH: TSH: 5.32 u[IU]/mL — ABNORMAL HIGH (ref 0.35–4.50)

## 2016-08-29 NOTE — Progress Notes (Addendum)
Catherine [name] 15-Oct-1950 [id number]        65 y.o.  G0P0 for annual exam.  Presents for her annual exam.  Past medical history,surgical history, problem list, medications, allergies, family history and social history were all reviewed and documented as reviewed in the EPIC chart.  ROS:  Performed with pertinent positives and negatives included in the history, assessment and plan.   Additional significant findings :  None   Exam: Caryn Bee assistant Vitals:   08/29/16 1422  BP: 116/70  Weight: 137 lb (62.1 kg)  Height: 5\' 6"  (1.676 m)   Body mass index is 22.11 kg/m.  General appearance:  Normal affect, orientation and appearance. Skin: Grossly normal HEENT: Without gross lesions.  No cervical or supraclavicular adenopathy. Thyroid normal.  Lungs:  Clear without wheezing, rales or rhonchi Cardiac: RR, without RMG Abdominal:  Soft, nontender, without masses, guarding, rebound, organomegaly or hernia Breasts:  Examined lying and sitting without masses, retractions, discharge or axillary adenopathy.   Left nipple dimpled as always. Pelvic:  Ext, BUS, Vagina with atrophic changes  Cervix with atrophic changes  Uterus anteverted, normal size, shape and contour, midline and mobile nontender   Adnexa without masses or tenderness    Anus and perineum normal   Rectovaginal normal sphincter tone without palpated masses or tenderness.    Assessment/Plan:  65 y.o. G0P0 female for annual exam.   1. Postmenopausal/atrophic genital changes. No significant hot flushes, night sweats, vaginal dryness or any vaginal bleeding. Continue to monitor report any issues or bleeding. 2. Osteoporosis.  DEXA today continues to show osteoporosis with a T score of -3.5 at the AP spine with a 12% loss from her prior DEXA 2 years ago. Has also demonstrated a 14% loss at the right femoral neck and 6% loss at the left femoral neck. History of Actonel 2002 through 2012. She did have a secondary workup to  include a normal TSH PTH and vitamin D. Is having a vitamin D run through her primary physician's office now. Currently on extra vitamin D and LCM. Is active with walking.  I reviewed her findings and options for management to include reinitiation of treatment. Options for reinitiating bisphosphonate versus trying a different class of drugs such as Prolia, Evista Forteo. Also discussed Reclast option. My recommendation would be to consider Prolia. Risks to include osteonecrosis of the jaw, atypical fractures, rashes and infections were reviewed. Patient understands and accepts and wants to go ahead and initiate treatment. We will tentatively plan on a 5 year course with follow up DEXA in 2 years. We will arrange for this and she knows to expect a phone call to initiate treatment. I did review with her that Teola Bradley recently switched machines which makes interpretation a little more difficult but given the clear differences between the 2 studies we feel it is most appropriate to treat at this time. 3. Mammography recently. Results pending. Continue with annual mammography when due. SBE monthly reviewed. 4. Colonoscopy 2017. Repeat at their recommended interval. 5. Pap smear 2015. No Pap smear done today. No history of significant abnormal Pap smears. 6. Health maintenance. No routine lab work done as this is done elsewhere. Follow up for Prolia initiation. Follow up in one year for annual exam.  Additional time in excess of her routine GYN exam was spent in direct face to face counseling and coordination of care in regards to her osteoporosis, review of her DEXA done elsewhere and review of treatment options with ultimate prescription  for Prolia.    Anastasio Auerbach MD, 3:07 PM 08/29/2016

## 2016-08-29 NOTE — Patient Instructions (Addendum)
Office will call you to arrange for the Prolia. Call if you do not hear from the office within 2 weeks

## 2016-09-19 ENCOUNTER — Telehealth: Payer: Self-pay | Admitting: Gynecology

## 2016-09-19 NOTE — Telephone Encounter (Signed)
PC to pt left Vm need her insurance information for Prolia. Notes Recorded by Anastasio Auerbach, MD on 09/19/2016   "Patient should be in the process of arranging for Prolia."   Just wanted to be sure you were aware.  This not was sent by Juliann Pulse A.

## 2016-10-01 ENCOUNTER — Other Ambulatory Visit: Payer: Medicare Other

## 2016-10-01 DIAGNOSIS — R7989 Other specified abnormal findings of blood chemistry: Secondary | ICD-10-CM

## 2016-10-03 ENCOUNTER — Ambulatory Visit (INDEPENDENT_AMBULATORY_CARE_PROVIDER_SITE_OTHER): Payer: Medicare Other | Admitting: General Practice

## 2016-10-03 DIAGNOSIS — Z23 Encounter for immunization: Secondary | ICD-10-CM

## 2016-10-03 NOTE — Progress Notes (Signed)
Injection given.   Shaleka Brines J Francetta Ilg, MD  

## 2016-10-07 NOTE — Telephone Encounter (Signed)
Calcium 9.6  08/29/16, Insurance no deductible, Co pay with OV $40, OOPM $4000 (met), No PA, Complete exam TF 08/29/16  M81.0

## 2016-10-08 ENCOUNTER — Encounter: Payer: Self-pay | Admitting: Gynecology

## 2016-10-09 NOTE — Telephone Encounter (Signed)
Prolia injection explained to pt. , she will call back to schedule after she looks at her schedule.

## 2016-10-16 NOTE — Telephone Encounter (Signed)
Patient will be [place] of the [place] June and [place] .  Would like  Prolia injection on  Feb 19  11:30  And   August 22 11:30 .  Nurse only

## 2016-10-28 ENCOUNTER — Ambulatory Visit: Payer: Medicare PPO

## 2016-10-29 NOTE — Telephone Encounter (Signed)
Patient cancelled appointment for Prolia.  LM for her to reschedule and please call me if she has decided not to take Prolia.

## 2016-10-30 ENCOUNTER — Ambulatory Visit (INDEPENDENT_AMBULATORY_CARE_PROVIDER_SITE_OTHER): Payer: Medicare Other | Admitting: Gynecology

## 2016-10-30 DIAGNOSIS — M810 Age-related osteoporosis without current pathological fracture: Secondary | ICD-10-CM | POA: Diagnosis not present

## 2016-10-30 MED ORDER — DENOSUMAB 60 MG/ML ~~LOC~~ SOLN
60.0000 mg | Freq: Once | SUBCUTANEOUS | Status: AC
  Administered 2016-10-30: 60 mg via SUBCUTANEOUS

## 2016-11-04 NOTE — Telephone Encounter (Signed)
Prolia given on 10/30/16  Next injection due after 04/30/17  .

## 2016-11-08 ENCOUNTER — Other Ambulatory Visit (INDEPENDENT_AMBULATORY_CARE_PROVIDER_SITE_OTHER): Payer: Medicare Other

## 2016-11-08 DIAGNOSIS — R946 Abnormal results of thyroid function studies: Secondary | ICD-10-CM | POA: Diagnosis not present

## 2016-11-08 DIAGNOSIS — R7989 Other specified abnormal findings of blood chemistry: Secondary | ICD-10-CM

## 2016-11-08 LAB — T3, FREE: T3 FREE: 3.4 pg/mL (ref 2.3–4.2)

## 2016-11-08 LAB — T4, FREE: Free T4: 0.73 ng/dL (ref 0.60–1.60)

## 2016-11-08 LAB — TSH: TSH: 3.11 u[IU]/mL (ref 0.35–4.50)

## 2016-11-09 LAB — THYROID ANTIBODIES
THYROID PEROXIDASE ANTIBODY: 143 [IU]/mL — AB (ref <9)
Thyroglobulin Ab: <1 IU/mL (ref <2)

## 2016-11-10 ENCOUNTER — Encounter: Payer: Self-pay | Admitting: Internal Medicine

## 2016-11-10 DIAGNOSIS — R768 Other specified abnormal immunological findings in serum: Secondary | ICD-10-CM | POA: Insufficient documentation

## 2016-11-10 DIAGNOSIS — R7989 Other specified abnormal findings of blood chemistry: Secondary | ICD-10-CM

## 2017-04-14 ENCOUNTER — Encounter: Payer: Self-pay | Admitting: Emergency Medicine

## 2017-04-14 ENCOUNTER — Ambulatory Visit (INDEPENDENT_AMBULATORY_CARE_PROVIDER_SITE_OTHER): Payer: Medicare Other | Admitting: Emergency Medicine

## 2017-04-14 ENCOUNTER — Ambulatory Visit: Payer: Medicare Other | Admitting: Family Medicine

## 2017-04-14 VITALS — BP 122/74 | HR 60 | Temp 98.8°F | Resp 16 | Ht 67.0 in | Wt 140.6 lb

## 2017-04-14 DIAGNOSIS — R21 Rash and other nonspecific skin eruption: Secondary | ICD-10-CM | POA: Diagnosis not present

## 2017-04-14 DIAGNOSIS — W57XXXA Bitten or stung by nonvenomous insect and other nonvenomous arthropods, initial encounter: Secondary | ICD-10-CM

## 2017-04-14 MED ORDER — DOXYCYCLINE HYCLATE 100 MG PO TABS: 100.0000 mg | ORAL_TABLET | Freq: Two times a day (BID) | ORAL | 0 refills | Status: AC

## 2017-04-14 MED ORDER — PREDNISONE 20 MG PO TABS: 40.0000 mg | ORAL_TABLET | Freq: Every day | ORAL | 0 refills | Status: AC

## 2017-04-14 NOTE — Progress Notes (Signed)
Catherine [name] 66 y.o.   Chief Complaint  Patient presents with  . New Patient (Initial Visit)    red spots(blotches) that are blistering, 1st ones seen on left  arm near elbow onset on Sat 8/4 and noticed new ones on back and legs on 8/5, per patient got 1st shingles shot on 01/24/17 and now due for dose #2  and wonders if this is shingles    HISTORY OF PRESENT ILLNESS: This is a 66 y.o. female complaining of vesicular rash to left arm, back and right leg x several days. No known cause.  HPI   Prior to Admission medications   Medication Sig Start Date End Date Taking? Authorizing Provider  calcium gluconate 500 MG tablet Take 500 mg by mouth daily.   Yes [provider]  Cholecalciferol (VITAMIN D) 1000 UNITS capsule Take 1,000 Units by mouth daily.     Yes [provider]  CRANBERRY PO Take 300 mg by mouth.   Yes [provider]  glucosamine-chondroitin 500-400 MG tablet Take 1 tablet by mouth daily.     Yes [provider]  Multiple Vitamin (MULTIVITAMIN) capsule Take 1 capsule by mouth daily.     Yes [provider]  Omega-3 Fatty Acids (FISH OIL) 1000 MG CAPS Take 1 capsule by mouth daily.     Yes [provider]  TURMERIC PO Take 1,000 mg by mouth daily.   Yes [provider]  doxycycline (VIBRA-TABS) 100 MG tablet Take 1 tablet (100 mg total) by mouth 2 (two) times daily. 04/14/17 04/21/17  Horald Pollen, MD  predniSONE (DELTASONE) 20 MG tablet Take 2 tablets (40 mg total) by mouth daily with breakfast. 04/14/17 04/19/17  Horald Pollen, MD    Allergies  Allergen Reactions  . Avocado Nausea And Vomiting  . Oysters [Shellfish Allergy] Nausea And Vomiting    BUT EATS SHRIMP AND NO ALLERGY TO IODINE    Patient Active Problem List   Diagnosis Date Noted  . Thyroid antibody positive 11/10/2016  . Elevated TSH 08/29/2016  . Osteoporosis 08/16/2015  . Hx of colonic polyps 07/04/2012  . HYPERCHOLESTEROLEMIA  05/06/2008    Past Medical History:  Diagnosis Date  . Colon polyp   . Osteoporosis 08/2014   T score -2.6    Past Surgical History:  Procedure Laterality Date  . COLONOSCOPY    . COLONOSCOPY WITH PROPOFOL N/A 06/17/2016   Procedure: COLONOSCOPY WITH PROPOFOL;  Surgeon: Gatha Mayer, MD;  Location: [facility];  Service: Endoscopy;  Laterality: N/A;  . HOT HEMOSTASIS N/A 06/17/2016   Procedure: HOT HEMOSTASIS (ARGON PLASMA COAGULATION/BICAP);  Surgeon: Gatha Mayer, MD;  Location: [facility];  Service: Endoscopy;  Laterality: N/A;  . NO PAST SURGERIES    . POLYPECTOMY      Social History   Social History  . Marital status: Married    Spouse name: N/A  . Number of children: N/A  . Years of education: N/A   Occupational History  . retired    Social History Main Topics  . Smoking status: Never Smoker  . Smokeless tobacco: Never Used  . Alcohol use 0.0 oz/week     Comment: wine with dinner  . Drug use: No  . Sexual activity: Yes    Birth control/ protection: Post-menopausal     Comment: 1st intercourse 66 yo-Fewer than 5 partners   Other Topics Concern  . Not on file   Social History Narrative   Exercises regularly  Family History  Problem Relation Age of Onset  . Hypothyroidism Mother   . Thyroid disease Mother   . Lung cancer Father        smoker  . Thyroid disease Sister   . Thyroid disease Brother   . Thyroid disease Sister   . Colon cancer Neg Hx   . Colon polyps Neg Hx   . Esophageal cancer Neg Hx   . Rectal cancer Neg Hx   . Stomach cancer Neg Hx      Review of Systems  Constitutional: Negative.  Negative for chills and fever.  HENT: Negative.  Negative for sore throat.   Eyes: Negative.   Respiratory: Negative.  Negative for shortness of breath.   Cardiovascular: Negative.  Negative for chest pain and palpitations.  Gastrointestinal: Negative.  Negative for nausea and vomiting.  Genitourinary: Negative.   Musculoskeletal: Negative.   Negative for back pain and myalgias.  Skin: Positive for itching and rash.  Endo/Heme/Allergies: Negative.   All other systems reviewed and are negative.   Vitals:   04/14/17 1117  BP: 122/74  Pulse: 60  Resp: 16  Temp: 98.8 F (37.1 C)    Physical Exam  Constitutional: She is oriented to person, place, and time. She appears well-developed and well-nourished.  HENT:  Head: Normocephalic and atraumatic.  Eyes: Pupils are equal, round, and reactive to light. EOM are normal.  Neck: Normal range of motion. Neck supple.  Cardiovascular: Normal rate and regular rhythm.   Pulmonary/Chest: Effort normal.  Musculoskeletal: Normal range of motion.  Neurological: She is alert and oriented to person, place, and time. No sensory deficit. She exhibits normal muscle tone.  Skin: Skin is warm and dry. Capillary refill takes less than 2 seconds. Rash noted.  +vesicular rash to left forearm; +erythematous spots to left lower back and right thigh  Psychiatric: She has a normal mood and affect. Her behavior is normal.  Vitals reviewed.    ASSESSMENT & [name]: Catherine [name] was seen today for new patient (initial visit).  Diagnoses and all orders for this visit:  Bug bite with infection, initial encounter  Skin rash  Other orders -     predniSONE (DELTASONE) 20 MG tablet; Take 2 tablets (40 mg total) by mouth daily with breakfast. -     doxycycline (VIBRA-TABS) 100 MG tablet; Take 1 tablet (100 mg total) by mouth 2 (two) times daily.   No sign of shingles.  Patient Instructions       IF you received an x-ray today, you will receive an invoice from [facility]. Please contact [facility] at [phone] with questions or concerns regarding your invoice.   IF you received labwork today, you will receive an invoice from [place]. Please contact [facility] at [phone] with questions or concerns regarding your invoice.   Our billing staff will not be able to assist you with  questions regarding bills from these companies.  You will be contacted with the lab results as soon as they are available. The fastest way to get your results is to activate your My Chart account. Instructions are located on the last page of this paperwork. If you have not heard from [place] regarding the results in 2 weeks, please contact this office.     Insect Bite, Adult An insect bite can make your skin red, itchy, and swollen. Some insects can spread disease to people with a bite. However, most insect bites do not lead to disease, and most are not serious. Follow these instructions  at home: Bite area care  Do not scratch the bite area.  Keep the bite area clean and dry.  Wash the bite area every day with soap and water as told by your doctor.  Check the bite area every day for signs of infection. Check for: ? More redness, swelling, or pain. ? Fluid or blood. ? Warmth. ? Pus. Managing pain, itching, and swelling  You may put any of these on the bite area as told by your doctor: ? A baking soda paste. ? Cortisone cream. ? Calamine lotion.  If directed, put ice on the bite area. ? Put ice in a plastic bag. ? Place a towel between your skin and the bag. ? Leave the ice on for 20 minutes, 2-3 times a day. Medicines  Take medicines or put medicines on your skin only as told by your doctor.  If you were prescribed an antibiotic medicine, use it as told by your doctor. Do not stop using the antibiotic even if your condition improves. General instructions  Keep all follow-up visits as told by your doctor. This is important. How is this prevented? To help you have a lower risk of insect bites:  When you are outside, wear clothing that covers your arms and legs.  Use insect repellent. The best insect repellents have: ? An active ingredient of DEET, picaridin, oil of lemon eucalyptus (OLE), or IR3535. ? Higher amounts of DEET or another active ingredient than other repellents  have.  If your home windows do not have screens, think about putting some in.  Contact a doctor if:  You have more redness, swelling, or pain in the bite area.  You have fluid, blood, or pus coming from the bite area.  The bite area feels warm.  You have a fever. Get help right away if:  You have joint pain.  You have a rash.  You have shortness of breath.  You feel more tired or sleepy than you normally do.  You have neck pain.  You have a headache.  You feel weaker than you normally do.  You have chest pain.  You have pain in your belly.  You feel sick to your stomach (nauseous) or you throw up (vomit). Summary  An insect bite can make your skin red, itchy, and swollen.  Do not scratch the bite area, and keep it clean and dry.  Ice can help with pain and itching from the bite. This information is not intended to replace advice given to you by your health care provider. Make sure you discuss any questions you have with your health care provider. Document Released: 08/23/2000 Document Revised: 03/28/2016 Document Reviewed: 01/11/2015 Elsevier Interactive Patient Education  2018 Elsevier Inc.      Agustina Caroli, MD [facility]

## 2017-04-14 NOTE — Patient Instructions (Addendum)
   IF you received an x-ray today, you will receive an invoice from [facility]. Please contact [facility] at [phone] with questions or concerns regarding your invoice.   IF you received labwork today, you will receive an invoice from [facility]. Please contact [facility] at [phone] with questions or concerns regarding your invoice.   Our billing staff will not be able to assist you with questions regarding bills from these companies.  You will be contacted with the lab results as soon as they are available. The fastest way to get your results is to activate your My Chart account. Instructions are located on the last page of this paperwork. If you have not heard from us regarding the results in 2 weeks, please contact this office.      Insect Bite, Adult An insect bite can make your skin red, itchy, and swollen. Some insects can spread disease to people with a bite. However, most insect bites do not lead to disease, and most are not serious. Follow these instructions at home: Bite area care  Do not scratch the bite area.  Keep the bite area clean and dry.  Wash the bite area every day with soap and water as told by your doctor.  Check the bite area every day for signs of infection. Check for: ? More redness, swelling, or pain. ? Fluid or blood. ? Warmth. ? Pus. Managing pain, itching, and swelling  You may put any of these on the bite area as told by your doctor: ? A baking soda paste. ? Cortisone cream. ? Calamine lotion.  If directed, put ice on the bite area. ? Put ice in a plastic bag. ? Place a towel between your skin and the bag. ? Leave the ice on for 20 minutes, 2-3 times a day. Medicines  Take medicines or put medicines on your skin only as told by your doctor.  If you were prescribed an antibiotic medicine, use it as told by your doctor. Do not stop using the antibiotic even if your condition improves. General instructions  Keep all  follow-up visits as told by your doctor. This is important. How is this prevented? To help you have a lower risk of insect bites:  When you are outside, wear clothing that covers your arms and legs.  Use insect repellent. The best insect repellents have: ? An active ingredient of DEET, picaridin, oil of lemon eucalyptus (OLE), or IR3535. ? Higher amounts of DEET or another active ingredient than other repellents have.  If your home windows do not have screens, think about putting some in.  Contact a doctor if:  You have more redness, swelling, or pain in the bite area.  You have fluid, blood, or pus coming from the bite area.  The bite area feels warm.  You have a fever. Get help right away if:  You have joint pain.  You have a rash.  You have shortness of breath.  You feel more tired or sleepy than you normally do.  You have neck pain.  You have a headache.  You feel weaker than you normally do.  You have chest pain.  You have pain in your belly.  You feel sick to your stomach (nauseous) or you throw up (vomit). Summary  An insect bite can make your skin red, itchy, and swollen.  Do not scratch the bite area, and keep it clean and dry.  Ice can help with pain and itching from the bite. This information is   not intended to replace advice given to you by your health care provider. Make sure you discuss any questions you have with your health care provider. Document Released: 08/23/2000 Document Revised: 03/28/2016 Document Reviewed: 01/11/2015 Elsevier Interactive Patient Education  2018 Elsevier Inc.  

## 2017-04-22 ENCOUNTER — Telehealth: Payer: Self-pay | Admitting: Gynecology

## 2017-04-28 ENCOUNTER — Ambulatory Visit: Payer: Medicare PPO

## 2017-04-29 NOTE — Telephone Encounter (Signed)
Prolia due after 04/30/17  . Calcium 9.6 08/2016, complete TF 08/2016, No PA, No Deductible, Co pay $40 with /with out OV.  oopm $4000 ($128 met). Scheduled by pt 05/01/17

## 2017-04-30 ENCOUNTER — Encounter: Payer: Self-pay | Admitting: Gynecology

## 2017-04-30 NOTE — Telephone Encounter (Signed)
VC to pt coming in tomorrow for injection. Explained that we will recheck benefits in 2019

## 2017-05-01 ENCOUNTER — Ambulatory Visit (INDEPENDENT_AMBULATORY_CARE_PROVIDER_SITE_OTHER): Payer: Medicare Other | Admitting: Gynecology

## 2017-05-01 DIAGNOSIS — M810 Age-related osteoporosis without current pathological fracture: Secondary | ICD-10-CM

## 2017-05-01 MED ORDER — DENOSUMAB 60 MG/ML ~~LOC~~ SOLN
60.0000 mg | Freq: Once | SUBCUTANEOUS | Status: AC
  Administered 2017-05-01: 60 mg via SUBCUTANEOUS

## 2017-05-06 NOTE — Telephone Encounter (Signed)
Injection Prolia given 05/01/17 next due 11/03/2017

## 2017-05-20 ENCOUNTER — Encounter: Payer: Self-pay | Admitting: Internal Medicine

## 2017-05-20 ENCOUNTER — Other Ambulatory Visit (INDEPENDENT_AMBULATORY_CARE_PROVIDER_SITE_OTHER): Payer: Medicare Other

## 2017-05-20 DIAGNOSIS — R7989 Other specified abnormal findings of blood chemistry: Secondary | ICD-10-CM

## 2017-05-20 DIAGNOSIS — R768 Other specified abnormal immunological findings in serum: Secondary | ICD-10-CM

## 2017-05-20 DIAGNOSIS — R760 Raised antibody titer: Secondary | ICD-10-CM | POA: Diagnosis not present

## 2017-05-20 DIAGNOSIS — R946 Abnormal results of thyroid function studies: Secondary | ICD-10-CM | POA: Diagnosis not present

## 2017-05-20 LAB — TSH: TSH: 4.07 u[IU]/mL (ref 0.35–4.50)

## 2017-08-13 ENCOUNTER — Encounter: Payer: Self-pay | Admitting: Internal Medicine

## 2017-08-25 ENCOUNTER — Telehealth: Payer: Self-pay | Admitting: Internal Medicine

## 2017-08-25 NOTE — Telephone Encounter (Signed)
Patient notified that she is due for colon now.  She needs APC and will need to be performed at the hospital.  Patient is scheduled for colon [facility] 11/03/17 8:30.  She will come for a pre-visit on 10/27/17 11;00

## 2017-08-27 ENCOUNTER — Other Ambulatory Visit: Payer: Self-pay

## 2017-08-27 DIAGNOSIS — Z86010 Personal history of colonic polyps: Secondary | ICD-10-CM

## 2017-09-15 ENCOUNTER — Telehealth: Payer: Self-pay | Admitting: Gynecology

## 2017-09-15 DIAGNOSIS — M810 Age-related osteoporosis without current pathological fracture: Secondary | ICD-10-CM

## 2017-09-15 NOTE — Telephone Encounter (Addendum)
Spoke with pt to verify if insurance is the same due to the new year  Pt verified same insurance   Sent in a request to [employer]/Prolia for approval

## 2017-09-30 NOTE — Telephone Encounter (Addendum)
Complete exam with Dr Phineas Real scheduled 10/21/17 No Calcium level ? PCP needs if not available and current.  Prolia due 11/03/16 pt will not be able to have Prolia the day of her complete exam.   NO Calcium level seen in Epic or K[facility].

## 2017-10-09 ENCOUNTER — Encounter: Payer: Medicare Other | Admitting: Internal Medicine

## 2017-10-13 NOTE — Telephone Encounter (Addendum)
Deductible Amount MET  OOP MAX $4000    Calcium LAB order in system will have Calcium same day as Annual Pt states she will call on Monday to get Calcium and Vit D labs  Calcium 9.3  Upcoming dental procedures NO  Prior Authorization needed NO  Pt estimated Cost $90       Coverage Details: Prolia is subject to a $50 co pay. Whether a OV is billed or not. The patient will be responsible for a $40 co pay  Pt aware of Cost  Pt states she would like to set a date to receive her prolia on day of Annual visit. Pt aware must be on or after 11/03/17 to receive Prolia  Appt 11/05/17

## 2017-10-17 ENCOUNTER — Other Ambulatory Visit: Payer: Self-pay

## 2017-10-17 ENCOUNTER — Encounter: Payer: Self-pay | Admitting: Gynecology

## 2017-10-21 ENCOUNTER — Ambulatory Visit: Payer: Medicare Other | Admitting: Gynecology

## 2017-10-21 ENCOUNTER — Encounter: Payer: Self-pay | Admitting: Gynecology

## 2017-10-21 VITALS — BP 120/70 | Ht 68.0 in | Wt 144.0 lb

## 2017-10-21 DIAGNOSIS — M810 Age-related osteoporosis without current pathological fracture: Secondary | ICD-10-CM

## 2017-10-21 DIAGNOSIS — N952 Postmenopausal atrophic vaginitis: Secondary | ICD-10-CM | POA: Diagnosis not present

## 2017-10-21 DIAGNOSIS — Z124 Encounter for screening for malignant neoplasm of cervix: Secondary | ICD-10-CM

## 2017-10-21 DIAGNOSIS — Z01411 Encounter for gynecological examination (general) (routine) with abnormal findings: Secondary | ICD-10-CM

## 2017-10-21 NOTE — Progress Notes (Signed)
    Seleni Meller 04-Sep-1951 [id number]        67 y.o.  G0P0 for annual gynecologic exam.  Doing well without gynecologic complaints.  Past medical history,surgical history, problem list, medications, allergies, family history and social history were all reviewed and documented as reviewed in the EPIC chart.  ROS:  Performed with pertinent positives and negatives included in the history, assessment and plan.   Additional significant findings : None   Exam: Caryn Bee assistant Vitals:   10/21/17 1029  BP: 120/70  Weight: 144 lb (65.3 kg)  Height: 5\' 8"  (1.727 m)   Body mass index is 21.9 kg/m.  General appearance:  Normal affect, orientation and appearance. Skin: Grossly normal HEENT: Without gross lesions.  No cervical or supraclavicular adenopathy. Thyroid normal.  Lungs:  Clear without wheezing, rales or rhonchi Cardiac: RR, without RMG Abdominal:  Soft, nontender, without masses, guarding, rebound, organomegaly or hernia Breasts:  Examined lying and sitting without masses, retractions, discharge or axillary adenopathy. Pelvic:  Ext, BUS, Vagina: With atrophic changes  Cervix: With atrophic changes.  Pap smear done  Uterus: Anteverted, normal size, shape and contour, midline and mobile nontender   Adnexa: Without masses or tenderness    Anus and perineum: Normal   Rectovaginal: Normal sphincter tone without palpated masses or tenderness.    Assessment/Plan:  68 y.o. G0P0 female for annual gynecologic exam.   1. Postmenopausal/atrophic genital changes.  No significant hot flushes, night sweats, vaginal dryness or any vaginal bleeding.  Continue to monitor and report any issues or bleeding. 2. Osteoporosis.  DEXA 10/2017 T score -2.9.  Was previously -3.5.  Starting her second year of Prolia.  Due for her injection coming up and she will follow-up for this.  Plan on doing this over the next 2 years with follow-up DEXA at that time. 3. Mammography 09/2017.  Continue with  annual mammography next year.  Breast exam normal today. 4. Colonoscopy 2017.  Repeat at their recommended interval. 5. Pap smear 2015.  Pap smear done today.  No history of abnormal Pap smears.  Options to stop screening per current screening guidelines based on age reviewed.  Will readdress on annual basis. 6. Health maintenance.  No routine lab work done as patient does this elsewhere.  Follow-up for Prolia when due otherwise 1 year, sooner as needed.   Anastasio Auerbach MD, 11:38 AM 10/21/2017

## 2017-10-21 NOTE — Patient Instructions (Signed)
Follow-up for Prolia shot when you are due.  Otherwise follow-up in 1 year for annual exam.

## 2017-10-24 LAB — PAP IG W/ RFLX HPV ASCU

## 2017-10-28 ENCOUNTER — Ambulatory Visit (AMBULATORY_SURGERY_CENTER): Payer: Self-pay

## 2017-10-28 VITALS — Ht 67.5 in | Wt 143.4 lb

## 2017-10-28 DIAGNOSIS — Z86010 Personal history of colonic polyps: Secondary | ICD-10-CM

## 2017-10-28 NOTE — Progress Notes (Signed)
Per pt, no allergies to soy or egg products.Pt not taking any weight loss meds or using  O2 at home.  Pt refused emmi video. 

## 2017-10-29 NOTE — Progress Notes (Signed)
Subjective:    Patient ID: Catherine [name], female    DOB: 08-24-1951, 67 y.o.   MRN: [id number]  HPI She is here for a physical exam.   She feels well and has no concerns.  She denies major changes since she was here last, except left cataract surgery.    Medications and allergies reviewed with patient and updated if appropriate.  Patient Active Problem List   Diagnosis Date Noted  . Thyroid antibody positive 11/10/2016  . Elevated TSH 08/29/2016  . Osteoporosis 08/16/2015  . Hx of colonic polyps 07/04/2012  . HYPERCHOLESTEROLEMIA 05/06/2008    Current Outpatient Medications on File Prior to Visit  Medication Sig Dispense Refill  . bisacodyl (DULCOLAX) 5 MG EC tablet Take 5 mg by mouth. Dulcolax 5 mg bowel prep #4-Take as directed    . calcium gluconate 500 MG tablet Take 500 mg by mouth daily.    . Cholecalciferol (VITAMIN D) 1000 UNITS capsule Take 1,000 Units by mouth daily.      Marland Kitchen CRANBERRY PO Take 300 mg by mouth daily.     Marland Kitchen denosumab (PROLIA) 60 MG/ML SOLN injection Inject 60 mg into the skin every 6 (six) months. Administer in upper arm, thigh, or abdomen    . glucosamine-chondroitin 500-400 MG tablet Take 1 tablet by mouth daily.      . Multiple Vitamin (MULTIVITAMIN) capsule Take 1 capsule by mouth daily.      . Omega-3 Fatty Acids (FISH OIL) 1000 MG CAPS Take 1,000 mg by mouth daily.     . Polyethylene Glycol 3350 (MIRALAX PO) Take by mouth. Miralax 238 gm bowel prep -Take as directed    . Turmeric 500 MG CAPS Take 500 mg by mouth daily.      No current facility-administered medications on file prior to visit.     Past Medical History:  Diagnosis Date  . Colon polyp 2017  . Osteoporosis 10/2017   T score -2.9    Past Surgical History:  Procedure Laterality Date  . COLONOSCOPY    . COLONOSCOPY WITH PROPOFOL N/A 06/17/2016   Procedure: COLONOSCOPY WITH PROPOFOL;  Surgeon: Gatha Mayer, MD;  Location: [facility];  Service: Endoscopy;  Laterality: N/A;  .  HOT HEMOSTASIS N/A 06/17/2016   Procedure: HOT HEMOSTASIS (ARGON PLASMA COAGULATION/BICAP);  Surgeon: Gatha Mayer, MD;  Location: [facility];  Service: Endoscopy;  Laterality: N/A;  . POLYPECTOMY      Social History   Socioeconomic History  . Marital status: Married    Spouse name: None  . Number of children: None  . Years of education: None  . Highest education level: None  Social Needs  . Financial resource strain: Not hard at all  . Food insecurity - worry: Never true  . Food insecurity - inability: Never true  . Transportation needs - medical: No  . Transportation needs - non-medical: No  Occupational History  . Occupation: retired  Tobacco Use  . Smoking status: Never Smoker  . Smokeless tobacco: Never Used  Substance and Sexual Activity  . Alcohol use: Yes    Alcohol/week: 0.0 oz    Comment: wine with dinner  . Drug use: No  . Sexual activity: Yes    Birth control/protection: Post-menopausal    Comment: 1st intercourse 67 yo-Fewer than 5 partners  Other Topics Concern  . None  Social History Narrative   Exercises regularly    Family History  Problem Relation Age of Onset  . Hypothyroidism Mother   .  Thyroid disease Mother   . Lung cancer Father        smoker  . Thyroid disease Sister   . Thyroid disease Sister   . Thyroid disease Sister   . Colon cancer Neg Hx   . Colon polyps Neg Hx   . Esophageal cancer Neg Hx   . Rectal cancer Neg Hx   . Stomach cancer Neg Hx     Review of Systems  Constitutional: Negative for chills, fatigue and fever.  HENT: Negative for hearing loss.   Eyes: Negative for visual disturbance.  Respiratory: Negative for cough, shortness of breath and wheezing.   Cardiovascular: Negative for chest pain, palpitations and leg swelling.  Gastrointestinal: Negative for abdominal pain, blood in stool, constipation, diarrhea and nausea.       Very rare gerd  Genitourinary: Negative for dysuria and hematuria.  Musculoskeletal:  Negative for arthralgias and gait problem.  Skin: Negative for color change (one mole on arm) and rash.  Neurological: Negative for light-headedness and headaches.  Psychiatric/Behavioral: Negative for dysphoric mood. The patient is not nervous/anxious.        Objective:   Vitals:   10/30/17 0852  BP: 112/62  Pulse: 62  Resp: 18  Temp: 98.3 F (36.8 C)  SpO2: 99%   Filed Weights   10/30/17 0852  Weight: 142 lb (64.4 kg)   Body mass index is 21.59 kg/m.  Wt Readings from Last 3 Encounters:  10/30/17 142 lb (64.4 kg)  10/28/17 143 lb 6.4 oz (65 kg)  10/21/17 144 lb (65.3 kg)     Physical Exam Constitutional: She appears well-developed and well-nourished. No distress.  HENT:  Head: Normocephalic and atraumatic.  Right Ear: External ear normal. Normal ear canal and TM Left Ear: External ear normal.  Normal ear canal and TM Mouth/Throat: Oropharynx is clear and moist.  Eyes: Conjunctivae and EOM are normal.  Neck: Neck supple. No tracheal deviation present. No thyromegaly present.  No carotid bruit  Cardiovascular: Normal rate, regular rhythm and normal heart sounds.   No murmur heard.  No edema. Pulmonary/Chest: Effort normal and breath sounds normal. No respiratory distress. She has no wheezes. She has no rales.  Breast: deferred to Gyn Abdominal: Soft. She exhibits no distension. There is no tenderness.  Lymphadenopathy: She has no cervical adenopathy.  Skin: Skin is warm and dry. She is not diaphoretic.  Psychiatric: She has a normal mood and affect. Her behavior is normal.        Assessment & Plan:   Physical exam: Screening blood work  ordered Immunizations   Had shingrix, other up to date Colonoscopy   Up to date  Mammogram   Up to date  Gyn   Up to date  Dexa   Up to date  - due 2020 - via gyn Eye exams    Up to date  EKG   Last done 2013 - not indicated Exercise  Regular - walks, goes to gym Weight  BMI very good Skin  One new mole -- appears  benign - she will monitor it and see derm if needed Substance abuse   none  See Problem List for Assessment and Plan of chronic medical problems.    FU in one year

## 2017-10-29 NOTE — Patient Instructions (Addendum)
Continue doing brain stimulating activities (puzzles, reading, adult coloring books, staying active) to keep memory sharp.   Continue to eat heart healthy diet (full of fruits, vegetables, whole grains, lean protein, water--limit salt, fat, and sugar intake) and increase physical activity as tolerated.  Catherine [name] , Thank you for taking time to come for your Medicare Wellness Visit. I appreciate your ongoing commitment to your health goals. Please review the following plan we discussed and let me know if I can assist you in the future.   These are the goals we discussed: Goals    . patient     Continue to maintain current health status by exercising and eating healthy. Ensure that I get adequate sleep, enjoy life, family and friends.        This is a list of the screening recommended for you and due dates:  Health Maintenance  Topic Date Due  . Mammogram  08/23/2018  . DEXA scan (bone density measurement)  10/18/2019  . Tetanus Vaccine  01/28/2021  . Colon Cancer Screening  06/17/2026  . Flu Shot  Completed  .  Hepatitis C: One time screening is recommended by Center for Disease Control  (CDC) for  adults born from [birth year] through 1966.   Completed  . Pneumonia vaccines  Completed    Test(s) ordered today. Your results will be released to [place] (or called to you) after review, usually within 72hours after test completion. If any changes need to be made, you will be notified at that same time.  All other Health Maintenance issues reviewed.   All recommended immunizations and age-appropriate screenings are up-to-date or discussed.  No immunizations administered today.   Medications reviewed and updated.   No changes recommended at this time.   Please followup in one year    Health Maintenance, Female Adopting a healthy lifestyle and getting preventive care can go a long way to promote health and wellness. Talk with your health care provider about what schedule of regular  examinations is right for you. This is a good chance for you to check in with your provider about disease prevention and staying healthy. In between checkups, there are plenty of things you can do on your own. Experts have done a lot of research about which lifestyle changes and preventive measures are most likely to keep you healthy. Ask your health care provider for more information. Weight and diet Eat a healthy diet  Be sure to include plenty of vegetables, fruits, low-fat dairy products, and lean protein.  Do not eat a lot of foods high in solid fats, added sugars, or salt.  Get regular exercise. This is one of the most important things you can do for your health. ? Most adults should exercise for at least 150 minutes each week. The exercise should increase your heart rate and make you sweat (moderate-intensity exercise). ? Most adults should also do strengthening exercises at least twice a week. This is in addition to the moderate-intensity exercise.  Maintain a healthy weight  Body mass index (BMI) is a measurement that can be used to identify possible weight problems. It estimates body fat based on height and weight. Your health care provider can help determine your BMI and help you achieve or maintain a healthy weight.  For females 30 years of age and older: ? A BMI below 18.5 is considered underweight. ? A BMI of 18.5 to 24.9 is normal. ? A BMI of 25 to 29.9 is considered overweight. ? A BMI  of 30 and above is considered obese.  Watch levels of cholesterol and blood lipids  You should start having your blood tested for lipids and cholesterol at 68 years of age, then have this test every 5 years.  You may need to have your cholesterol levels checked more often if: ? Your lipid or cholesterol levels are high. ? You are older than 68 years of age. ? You are at high risk for heart disease.  Cancer screening Lung Cancer  Lung cancer screening is recommended for adults 43-67  years old who are at high risk for lung cancer because of a history of smoking.  A yearly low-dose CT scan of the lungs is recommended for people who: ? Currently smoke. ? Have quit within the past 15 years. ? Have at least a 30-pack-year history of smoking. A pack year is smoking an average of one pack of cigarettes a day for 1 year.  Yearly screening should continue until it has been 15 years since you quit.  Yearly screening should stop if you develop a health problem that would prevent you from having lung cancer treatment.  Breast Cancer  Practice breast self-awareness. This means understanding how your breasts normally appear and feel.  It also means doing regular breast self-exams. Let your health care provider know about any changes, no matter how small.  If you are in your 20s or 30s, you should have a clinical breast exam (CBE) by a health care provider every 1-3 years as part of a regular health exam.  If you are 61 or older, have a CBE every year. Also consider having a breast X-ray (mammogram) every year.  If you have a family history of breast cancer, talk to your health care provider about genetic screening.  If you are at high risk for breast cancer, talk to your health care provider about having an MRI and a mammogram every year.  Breast cancer gene (BRCA) assessment is recommended for women who have family members with BRCA-related cancers. BRCA-related cancers include: ? Breast. ? Ovarian. ? Tubal. ? Peritoneal cancers.  Results of the assessment will determine the need for genetic counseling and BRCA1 and BRCA2 testing.  Cervical Cancer Your health care provider may recommend that you be screened regularly for cancer of the pelvic organs (ovaries, uterus, and vagina). This screening involves a pelvic examination, including checking for microscopic changes to the surface of your cervix (Pap test). You may be encouraged to have this screening done every 3 years,  beginning at age 40.  For women ages 54-65, health care providers may recommend pelvic exams and Pap testing every 3 years, or they may recommend the Pap and pelvic exam, combined with testing for human papilloma virus (HPV), every 5 years. Some types of HPV increase your risk of cervical cancer. Testing for HPV may also be done on women of any age with unclear Pap test results.  Other health care providers may not recommend any screening for nonpregnant women who are considered low risk for pelvic cancer and who do not have symptoms. Ask your health care provider if a screening pelvic exam is right for you.  If you have had past treatment for cervical cancer or a condition that could lead to cancer, you need Pap tests and screening for cancer for at least 20 years after your treatment. If Pap tests have been discontinued, your risk factors (such as having a new sexual partner) need to be reassessed to determine if screening should  resume. Some women have medical problems that increase the chance of getting cervical cancer. In these cases, your health care provider may recommend more frequent screening and Pap tests.  Colorectal Cancer  This type of cancer can be detected and often prevented.  Routine colorectal cancer screening usually begins at 68 years of age and continues through 68 years of age.  Your health care provider may recommend screening at an earlier age if you have risk factors for colon cancer.  Your health care provider may also recommend using home test kits to check for hidden blood in the stool.  A small camera at the end of a tube can be used to examine your colon directly (sigmoidoscopy or colonoscopy). This is done to check for the earliest forms of colorectal cancer.  Routine screening usually begins at age 68.  Direct examination of the colon should be repeated every 5-10 years through 68 years of age. However, you may need to be screened more often if early forms of  precancerous polyps or small growths are found.  Skin Cancer  Check your skin from head to toe regularly.  Tell your health care provider about any new moles or changes in moles, especially if there is a change in a mole's shape or color.  Also tell your health care provider if you have a mole that is larger than the size of a pencil eraser.  Always use sunscreen. Apply sunscreen liberally and repeatedly throughout the day.  Protect yourself by wearing long sleeves, pants, a wide-brimmed hat, and sunglasses whenever you are outside.  Heart disease, diabetes, and high blood pressure  High blood pressure causes heart disease and increases the risk of stroke. High blood pressure is more likely to develop in: ? People who have blood pressure in the high end of the normal range (130-139/85-89 mm Hg). ? People who are overweight or obese. ? People who are African American.  If you are 55-70 years of age, have your blood pressure checked every 3-5 years. If you are 47 years of age or older, have your blood pressure checked every year. You should have your blood pressure measured twice-once when you are at a hospital or clinic, and once when you are not at a hospital or clinic. Record the average of the two measurements. To check your blood pressure when you are not at a hospital or clinic, you can use: ? An automated blood pressure machine at a pharmacy. ? A home blood pressure monitor.  If you are between 57 years and 81 years old, ask your health care provider if you should take aspirin to prevent strokes.  Have regular diabetes screenings. This involves taking a blood sample to check your fasting blood sugar level. ? If you are at a normal weight and have a low risk for diabetes, have this test once every three years after 68 years of age. ? If you are overweight and have a high risk for diabetes, consider being tested at a younger age or more often. Preventing infection Hepatitis B  If  you have a higher risk for hepatitis B, you should be screened for this virus. You are considered at high risk for hepatitis B if: ? You were born in a country where hepatitis B is common. Ask your health care provider which countries are considered high risk. ? Your parents were born in a high-risk country, and you have not been immunized against hepatitis B (hepatitis B vaccine). ? You have HIV or  AIDS. ? You use needles to inject street drugs. ? You live with someone who has hepatitis B. ? You have had sex with someone who has hepatitis B. ? You get hemodialysis treatment. ? You take certain medicines for conditions, including cancer, organ transplantation, and autoimmune conditions.  Hepatitis C  Blood testing is recommended for: ? Everyone born from [birth year] through 1965. ? Anyone with known risk factors for hepatitis C.  Sexually transmitted infections (STIs)  You should be screened for sexually transmitted infections (STIs) including gonorrhea and chlamydia if: ? You are sexually active and are younger than 68 years of age. ? You are older than 68 years of age and your health care provider tells you that you are at risk for this type of infection. ? Your sexual activity has changed since you were last screened and you are at an increased risk for chlamydia or gonorrhea. Ask your health care provider if you are at risk.  If you do not have HIV, but are at risk, it may be recommended that you take a prescription medicine daily to prevent HIV infection. This is called pre-exposure prophylaxis (PrEP). You are considered at risk if: ? You are sexually active and do not regularly use condoms or know the HIV status of your partner(s). ? You take drugs by injection. ? You are sexually active with a partner who has HIV.  Talk with your health care provider about whether you are at high risk of being infected with HIV. If you choose to begin PrEP, you should first be tested for HIV. You should  then be tested every 3 months for as long as you are taking PrEP. Pregnancy  If you are premenopausal and you may become pregnant, ask your health care provider about preconception counseling.  If you may become pregnant, take 400 to 800 micrograms (mcg) of folic acid every day.  If you want to prevent pregnancy, talk to your health care provider about birth control (contraception). Osteoporosis and menopause  Osteoporosis is a disease in which the bones lose minerals and strength with aging. This can result in serious bone fractures. Your risk for osteoporosis can be identified using a bone density scan.  If you are 11 years of age or older, or if you are at risk for osteoporosis and fractures, ask your health care provider if you should be screened.  Ask your health care provider whether you should take a calcium or vitamin D supplement to lower your risk for osteoporosis.  Menopause may have certain physical symptoms and risks.  Hormone replacement therapy may reduce some of these symptoms and risks. Talk to your health care provider about whether hormone replacement therapy is right for you. Follow these instructions at home:  Schedule regular health, dental, and eye exams.  Stay current with your immunizations.  Do not use any tobacco products including cigarettes, chewing tobacco, or electronic cigarettes.  If you are pregnant, do not drink alcohol.  If you are breastfeeding, limit how much and how often you drink alcohol.  Limit alcohol intake to no more than 1 drink per day for nonpregnant women. One drink equals 12 ounces of beer, 5 ounces of wine, or 1 ounces of hard liquor.  Do not use street drugs.  Do not share needles.  Ask your health care provider for help if you need support or information about quitting drugs.  Tell your health care provider if you often feel depressed.  Tell your health care provider if you  have ever been abused or do not feel safe at  home. This information is not intended to replace advice given to you by your health care provider. Make sure you discuss any questions you have with your health care provider. Document Released: 03/11/2011 Document Revised: 02/01/2016 Document Reviewed: 05/30/2015 Elsevier Interactive Patient Education  [employer].

## 2017-10-30 ENCOUNTER — Encounter: Payer: Self-pay | Admitting: Internal Medicine

## 2017-10-30 ENCOUNTER — Ambulatory Visit (INDEPENDENT_AMBULATORY_CARE_PROVIDER_SITE_OTHER): Payer: Medicare Other | Admitting: Internal Medicine

## 2017-10-30 ENCOUNTER — Other Ambulatory Visit (INDEPENDENT_AMBULATORY_CARE_PROVIDER_SITE_OTHER): Payer: Medicare Other

## 2017-10-30 VITALS — BP 112/62 | HR 62 | Temp 98.3°F | Resp 18 | Ht 68.0 in | Wt 142.0 lb

## 2017-10-30 DIAGNOSIS — Z0000 Encounter for general adult medical examination without abnormal findings: Secondary | ICD-10-CM

## 2017-10-30 DIAGNOSIS — R7989 Other specified abnormal findings of blood chemistry: Secondary | ICD-10-CM

## 2017-10-30 DIAGNOSIS — E7800 Pure hypercholesterolemia, unspecified: Secondary | ICD-10-CM

## 2017-10-30 DIAGNOSIS — M810 Age-related osteoporosis without current pathological fracture: Secondary | ICD-10-CM | POA: Diagnosis not present

## 2017-10-30 LAB — CBC WITH DIFFERENTIAL/PLATELET
BASOS ABS: 0 10*3/uL (ref 0.0–0.1)
Basophils Relative: 0.7 % (ref 0.0–3.0)
Eosinophils Absolute: 0.1 10*3/uL (ref 0.0–0.7)
Eosinophils Relative: 2.3 % (ref 0.0–5.0)
HCT: 39.5 % (ref 36.0–46.0)
Hemoglobin: 13.5 g/dL (ref 12.0–15.0)
LYMPHS ABS: 1.5 10*3/uL (ref 0.7–4.0)
Lymphocytes Relative: 32.7 % (ref 12.0–46.0)
MCHC: 34.3 g/dL (ref 30.0–36.0)
MCV: 96.7 fl (ref 78.0–100.0)
MONOS PCT: 8.1 % (ref 3.0–12.0)
Monocytes Absolute: 0.4 10*3/uL (ref 0.1–1.0)
NEUTROS ABS: 2.6 10*3/uL (ref 1.4–7.7)
NEUTROS PCT: 56.2 % (ref 43.0–77.0)
PLATELETS: 289 10*3/uL (ref 150.0–400.0)
RBC: 4.08 Mil/uL (ref 3.87–5.11)
RDW: 13.1 % (ref 11.5–15.5)
WBC: 4.7 10*3/uL (ref 4.0–10.5)

## 2017-10-30 LAB — LIPID PANEL
CHOL/HDL RATIO: 3
Cholesterol: 216 mg/dL — ABNORMAL HIGH (ref 0–200)
HDL: 83 mg/dL (ref >39.00)
LDL Cholesterol: 123 mg/dL — ABNORMAL HIGH (ref 0–99)
NONHDL: 133.49
Triglycerides: 54 mg/dL (ref 0.0–149.0)
VLDL: 10.8 mg/dL (ref 0.0–40.0)

## 2017-10-30 LAB — COMPREHENSIVE METABOLIC PANEL
ALK PHOS: 40 U/L (ref 39–117)
ALT: 17 U/L (ref 0–35)
AST: 24 U/L (ref 0–37)
Albumin: 4.2 g/dL (ref 3.5–5.2)
BILIRUBIN TOTAL: 0.8 mg/dL (ref 0.2–1.2)
BUN: 16 mg/dL (ref 6–23)
CO2: 29 meq/L (ref 19–32)
Calcium: 9.3 mg/dL (ref 8.4–10.5)
Chloride: 103 mEq/L (ref 96–112)
Creatinine, Ser: 0.8 mg/dL (ref 0.40–1.20)
GFR: 75.8 mL/min (ref >60.00)
GLUCOSE: 90 mg/dL (ref 70–99)
Potassium: 3.8 mEq/L (ref 3.5–5.1)
SODIUM: 139 meq/L (ref 135–145)
TOTAL PROTEIN: 7.2 g/dL (ref 6.0–8.3)

## 2017-10-30 LAB — VITAMIN D 25 HYDROXY (VIT D DEFICIENCY, FRACTURES): VITD: 46.75 ng/mL (ref 30.00–100.00)

## 2017-10-30 LAB — TSH: TSH: 2.67 u[IU]/mL (ref 0.35–4.50)

## 2017-10-30 NOTE — Assessment & Plan Note (Signed)
Management per Dr Phineas Real On prolia dexa up to date Exercising regularly Taking calcium and vitamin d daily

## 2017-10-30 NOTE — Assessment & Plan Note (Signed)
Check lipid panel, cmp, tsh Regular exercise and healthy diet encouraged   

## 2017-10-30 NOTE — Telephone Encounter (Signed)
Patient had visit with PCP today with lab work , Calcium level WNL  9.3 . I will inform Catherine [name] that this is normal and Patient can be scheduled for Prolia injection base on Epic pt has an appointment 11/05/17 for Prolia.

## 2017-10-30 NOTE — Assessment & Plan Note (Signed)
Check tsh Thyroid Ab positive in past Euthyroid

## 2017-10-30 NOTE — Progress Notes (Signed)
Subjective:   Catherine [name] is a 67 y.o. female who presents for Medicare Annual (Subsequent) preventive examination.  Review of Systems:  No ROS.  Medicare Wellness Visit. Additional risk factors are reflected in the social history.    Sleep patterns: feels rested on waking, gets up 1 times nightly to void and sleeps 7-8 hours nightly.   Home Safety/Smoke Alarms: Feels safe in home. Smoke alarms in place.  Living environment; residence and Firearm Safety: [place], no firearms Lives with husband, no needs for DME, good support system. Seat Belt Safety/Bike Helmet: Wears seat belt.    Objective:     Vitals: LMP 09/09/1996   There is no height or weight on file to calculate BMI.  Advanced Directives 06/17/2016 06/11/2016 05/31/2016 10/16/2015 10/04/2015  Does Patient Have a Medical Advance Directive? Yes Yes Yes Yes Yes  Type of Paramedic of [place];Living will [place];Living will [place];Living will Living will;Healthcare Power of Attorney Living will;Healthcare Power of Attorney  Does patient want to make changes to medical advance directive? - No - Patient declined - - -  Copy of [place] in Chart? - No - copy requested No - copy requested - -    Tobacco Social History   Tobacco Use  Smoking Status Never Smoker  Smokeless Tobacco Never Used     Counseling given: Not Answered  Past Medical History:  Diagnosis Date  . Colon polyp 2017  . Osteoporosis 10/2017   T score -2.9   Past Surgical History:  Procedure Laterality Date  . COLONOSCOPY    . COLONOSCOPY WITH PROPOFOL N/A 06/17/2016   Procedure: COLONOSCOPY WITH PROPOFOL;  Surgeon: Gatha Mayer, MD;  Location: [facility]COPY;  Service: Endoscopy;  Laterality: N/A;  . HOT HEMOSTASIS N/A 06/17/2016   Procedure: HOT HEMOSTASIS (ARGON PLASMA COAGULATION/BICAP);  Surgeon: Gatha Mayer, MD;  Location: [facility];  Service: Endoscopy;   Laterality: N/A;  . POLYPECTOMY     Family History  Problem Relation Age of Onset  . Hypothyroidism Mother   . Thyroid disease Mother   . Lung cancer Father        smoker  . Thyroid disease Sister   . Thyroid disease Sister   . Thyroid disease Sister   . Colon cancer Neg Hx   . Colon polyps Neg Hx   . Esophageal cancer Neg Hx   . Rectal cancer Neg Hx   . Stomach cancer Neg Hx    Social History   Socioeconomic History  . Marital status: Married    Spouse name: Not on file  . Number of children: Not on file  . Years of education: Not on file  . Highest education level: Not on file  Social Needs  . Financial resource strain: Not on file  . Food insecurity - worry: Not on file  . Food insecurity - inability: Not on file  . Transportation needs - medical: Not on file  . Transportation needs - non-medical: Not on file  Occupational History  . Occupation: retired  Tobacco Use  . Smoking status: Never Smoker  . Smokeless tobacco: Never Used  Substance and Sexual Activity  . Alcohol use: Yes    Alcohol/week: 0.0 oz    Comment: [place] with dinner  . Drug use: No  . Sexual activity: Yes    Birth control/protection: Post-menopausal    Comment: 1st intercourse 67 yo-Fewer than 5 partners  Other Topics Concern  .  Not on file  Social History Narrative   Exercises regularly    Outpatient Encounter Medications as of 10/30/2017  Medication Sig  . bisacodyl (DULCOLAX) 5 MG EC tablet Take 5 mg by mouth. Dulcolax 5 mg bowel prep #4-Take as directed  . calcium gluconate 500 MG tablet Take 500 mg by mouth daily.  . Cholecalciferol (VITAMIN D) 1000 UNITS capsule Take 1,000 Units by mouth daily.    Marland Kitchen CRANBERRY PO Take 300 mg by mouth daily.   Marland Kitchen denosumab (PROLIA) 60 MG/ML SOLN injection Inject 60 mg into the skin every 6 (six) months. Administer in upper arm, thigh, or abdomen  . glucosamine-chondroitin 500-400 MG tablet Take 1 tablet by mouth daily.    . Multiple Vitamin  (MULTIVITAMIN) capsule Take 1 capsule by mouth daily.    . Omega-3 Fatty Acids (FISH OIL) 1000 MG CAPS Take 1,000 mg by mouth daily.   . Polyethylene Glycol 3350 (MIRALAX PO) Take by mouth. Miralax 238 gm bowel prep -Take as directed  . Turmeric 500 MG CAPS Take 500 mg by mouth daily.    No facility-administered encounter medications on file as of 10/30/2017.     Activities of Daily Living No flowsheet data found.  Patient Care Team: Binnie Rail, MD as PCP - General (Internal Medicine)    Assessment:   This is a routine wellness examination for S[place]oya. Physical assessment deferred to PCP.   Exercise Activities and Dietary recommendations   Diet (meal preparation, eat out, water intake, caffeinated beverages, dairy products, fruits and vegetables): in general, a "healthy" diet  , well balanced, eats a variety of fruits and vegetables daily, limits salt, fat/cholesterol, sugar,carbohydrates,caffeine, drinks 6-8 glasses of water daily.  Goals    None      Fall Risk Fall Risk  04/14/2017 08/28/2016  Falls in the past year? No No    Depression Screen PHQ 2/9 Scores 04/14/2017 08/28/2016  PHQ - 2 Score 0 0     Cognitive Function       Ad8 score reviewed for issues:  Issues making decisions: no  Less interest in hobbies / activities: no  Repeats questions, stories (family complaining): no  Trouble using ordinary gadgets (microwave, computer, phone):no  Forgets the month or year: no  Mismanaging finances: no  Remembering appts: no  Daily problems with thinking and/or memory: no Ad8 score is= 0  Immunization History  Administered Date(s) Administered  . Hepatitis A 04/23/2006, 11/07/2006  . Hepatitis B 04/21/2013, 05/25/2013, 08/19/2013  . Influenza Split 07/03/2012, 06/11/2013  . Influenza Whole 06/30/2017  . Influenza, High Dose Seasonal PF 05/30/2016  . Influenza-Unspecified 06/16/2014, 06/19/2015  . PPD Test 05/28/2016  . Pneumococcal Conjugate-13  08/16/2015  . Pneumococcal Polysaccharide-23 10/03/2016  . Td 05/29/2005  . Tdap 01/29/2011  . Zoster 11/21/2014  . Zoster Recombinat (Shingrix) 01/24/2017, 05/14/2017   Screening Tests Health Maintenance  Topic Date Due  . MAMMOGRAM  08/23/2018  . DEXA SCAN  10/18/2019  . TETANUS/TDAP  01/28/2021  . COLONOSCOPY  06/17/2026  . INFLUENZA VACCINE  Completed  . Hepatitis C Screening  Completed  . PNA vac Low Risk Adult  Completed      Plan:    Continue doing brain stimulating activities (puzzles, reading, adult coloring books, staying active) to keep memory sharp.   Continue to eat heart healthy diet (full of fruits, vegetables, whole grains, lean protein, water--limit salt, fat, and sugar intake) and increase physical activity as tolerated.  I have personally reviewed and noted the  following in the patient's chart:   . Medical and social history . Use of alcohol, tobacco or illicit drugs  . Current medications and supplements . Functional ability and status . Nutritional status . Physical activity . Advanced directives . List of other physicians . Vitals . Screenings to include cognitive, depression, and falls . Referrals and appointments  In addition, I have reviewed and discussed with patient certain preventive protocols, quality metrics, and best practice recommendations. A written personalized care plan for preventive services as well as general preventive health recommendations were provided to patient.     Michiel Cowboy, RN  10/30/2017   Medical screening examination/treatment/procedure(s) were performed by non-physician practitioner and as supervising physician I was immediately available for consultation/collaboration. I agree with above. Binnie Rail, MD

## 2017-10-31 NOTE — Telephone Encounter (Signed)
Pt has appt scheduled 11/05/17

## 2017-11-03 ENCOUNTER — Ambulatory Visit (HOSPITAL_COMMUNITY): Payer: Medicare Other | Admitting: Registered Nurse

## 2017-11-03 ENCOUNTER — Encounter (HOSPITAL_COMMUNITY)
Admission: RE | Disposition: A | Discharge status: Home / Self Care | Payer: Self-pay | Source: Ambulatory Visit | Attending: Internal Medicine

## 2017-11-03 ENCOUNTER — Ambulatory Visit (HOSPITAL_COMMUNITY)
Admission: RE | Admit: 2017-11-03 | Discharge: 2017-11-03 | Disposition: A | Discharge status: Home / Self Care | Payer: Medicare Other | Source: Ambulatory Visit | Attending: Internal Medicine | Admitting: Internal Medicine

## 2017-11-03 ENCOUNTER — Encounter (HOSPITAL_COMMUNITY): Payer: Self-pay | Admitting: *Deleted

## 2017-11-03 ENCOUNTER — Other Ambulatory Visit: Payer: Self-pay

## 2017-11-03 DIAGNOSIS — Z1211 Encounter for screening for malignant neoplasm of colon: Secondary | ICD-10-CM | POA: Insufficient documentation

## 2017-11-03 DIAGNOSIS — Z860100 Personal history of colon polyps, unspecified: Secondary | ICD-10-CM

## 2017-11-03 DIAGNOSIS — M810 Age-related osteoporosis without current pathological fracture: Secondary | ICD-10-CM | POA: Diagnosis not present

## 2017-11-03 DIAGNOSIS — D123 Benign neoplasm of transverse colon: Secondary | ICD-10-CM | POA: Diagnosis present

## 2017-11-03 DIAGNOSIS — Z79899 Other long term (current) drug therapy: Secondary | ICD-10-CM | POA: Insufficient documentation

## 2017-11-03 DIAGNOSIS — Q439 Congenital malformation of intestine, unspecified: Secondary | ICD-10-CM | POA: Diagnosis not present

## 2017-11-03 DIAGNOSIS — K635 Polyp of colon: Secondary | ICD-10-CM | POA: Diagnosis not present

## 2017-11-03 HISTORY — PX: HOT HEMOSTASIS: SHX5433

## 2017-11-03 HISTORY — PX: COLONOSCOPY WITH PROPOFOL: SHX5780

## 2017-11-03 SURGERY — COLONOSCOPY WITH PROPOFOL
Anesthesia: Monitor Anesthesia Care

## 2017-11-03 MED ORDER — ONDANSETRON HCL 4 MG/2ML IJ SOLN
INTRAMUSCULAR | Status: DC | PRN
  Administered 2017-11-03: 4 mg via INTRAVENOUS

## 2017-11-03 MED ORDER — LACTATED RINGERS IV SOLN
INTRAVENOUS | Status: DC
  Administered 2017-11-03: 08:00:00 via INTRAVENOUS

## 2017-11-03 MED ORDER — SODIUM CHLORIDE 0.9 % IV SOLN: INTRAVENOUS | Status: DC

## 2017-11-03 MED ORDER — PROPOFOL 10 MG/ML IV BOLUS
INTRAVENOUS | Status: DC | PRN
  Administered 2017-11-03 (×2): 20 mg via INTRAVENOUS

## 2017-11-03 MED ORDER — PROPOFOL 500 MG/50ML IV EMUL
INTRAVENOUS | Status: DC | PRN
  Administered 2017-11-03: 100 ug/kg/min via INTRAVENOUS

## 2017-11-03 MED ORDER — PROPOFOL 10 MG/ML IV BOLUS
INTRAVENOUS | Status: AC
  Filled 2017-11-03: qty 60

## 2017-11-03 MED ORDER — LIDOCAINE 2% (20 MG/ML) 5 ML SYRINGE
INTRAMUSCULAR | Status: DC | PRN
  Administered 2017-11-03: 40 mg via INTRAVENOUS

## 2017-11-03 SURGICAL SUPPLY — 22 items

## 2017-11-03 NOTE — Transfer of Care (Signed)
Immediate Anesthesia Transfer of Care Note  Patient: Catherine [name]  Procedure(s) Performed: COLONOSCOPY WITH PROPOFOL (N/A ) HOT HEMOSTASIS (ARGON PLASMA COAGULATION/BICAP) (N/A )  Patient Location: PACU  Anesthesia Type:MAC  Level of Consciousness: sedated  Airway & Oxygen Therapy: Patient Spontanous Breathing and Patient connected to face mask oxygen  Post-op Assessment: Report given to RN and Post -op Vital signs reviewed and stable  Post vital signs: Reviewed and stable  Last Vitals:  Vitals:   11/03/17 0725  BP: (!) 129/54  Pulse: (!) 58  Resp: 12  Temp: 36.5 C  SpO2: 100%    Last Pain:  Vitals:   11/03/17 0725  TempSrc: Oral         Complications: No apparent anesthesia complications

## 2017-11-03 NOTE — H&P (Signed)
[facility] History and Physical   Primary Care Physician:  Binnie Rail, MD   Reason for Procedure:   F/U colon polyp  Plan:    Colonoscopy - The risks and benefits as well as alternatives of endoscopic procedure(s) have been discussed and reviewed. All questions answered. The patient agrees to proceed.  HPI: Catherine [name] is a 67 y.o. female with hx flat sessile serrated polyp in cecum here for close f/u/ to see if polyp removed/gone.   Past Medical History:  Diagnosis Date  . Colon polyp 2017  . Osteoporosis 10/2017   T score -2.9    Past Surgical History:  Procedure Laterality Date  . COLONOSCOPY    . COLONOSCOPY WITH PROPOFOL N/A 06/17/2016   Procedure: COLONOSCOPY WITH PROPOFOL;  Surgeon: Gatha Mayer, MD;  Location: [facility];  Service: Endoscopy;  Laterality: N/A;  . HOT HEMOSTASIS N/A 06/17/2016   Procedure: HOT HEMOSTASIS (ARGON PLASMA COAGULATION/BICAP);  Surgeon: Gatha Mayer, MD;  Location: [facility];  Service: Endoscopy;  Laterality: N/A;  . POLYPECTOMY      Prior to Admission medications   Medication Sig Start Date End Date Taking? Authorizing Provider  bisacodyl (DULCOLAX) 5 MG EC tablet Take 5 mg by mouth. Dulcolax 5 mg bowel prep #4-Take as directed   Yes [provider]  calcium gluconate 500 MG tablet Take 500 mg by mouth daily.   Yes [provider]  Cholecalciferol (VITAMIN D) 1000 UNITS capsule Take 1,000 Units by mouth daily.     Yes [provider]  CRANBERRY PO Take 300 mg by mouth daily.    Yes [provider]  glucosamine-chondroitin 500-400 MG tablet Take 1 tablet by mouth daily.     Yes [provider]  Multiple Vitamin (MULTIVITAMIN) capsule Take 1 capsule by mouth daily.     Yes [provider]  Omega-3 Fatty Acids (FISH OIL) 1000 MG CAPS Take 1,000 mg by mouth daily.    Yes [provider]  Polyethylene Glycol 3350 (MIRALAX PO) Take by mouth. Miralax 238 gm  bowel prep -Take as directed   Yes [provider]  Turmeric 500 MG CAPS Take 500 mg by mouth daily.    Yes [provider]  denosumab (PROLIA) 60 MG/ML SOLN injection Inject 60 mg into the skin every 6 (six) months. Administer in upper arm, thigh, or abdomen    [provider]    Current Facility-Administered Medications  Medication Dose Route Frequency Provider Last Rate Last Dose  . lactated ringers infusion   Intravenous Continuous Gatha Mayer, MD 20 mL/hr at 11/03/17 1829      Allergies as of 08/25/2017 - Review Complete 04/14/2017  Allergen Reaction Noted  . Avocado Nausea And Vomiting   . Oysters [shellfish allergy] Nausea And Vomiting     Family History  Problem Relation Age of Onset  . Hypothyroidism Mother   . Thyroid disease Mother   . Lung cancer Father        smoker  . Thyroid disease Sister   . Thyroid disease Sister   . Thyroid disease Sister   . Kidney Stones Sister   . Colon cancer Neg Hx   . Colon polyps Neg Hx   . Esophageal cancer Neg Hx   . Rectal cancer Neg Hx   . Stomach cancer Neg Hx     Social History   Socioeconomic History  . Marital status: Married    Spouse name: Not on file  . Number  of children: Not on file  . Years of education: Not on file  . Highest education level: Not on file  Social Needs  . Financial resource strain: Not hard at all  . Food insecurity - worry: Never true  . Food insecurity - inability: Never true  . Transportation needs - medical: No  . Transportation needs - non-medical: No  Occupational History  . Occupation: retired  Tobacco Use  . Smoking status: Never Smoker  . Smokeless tobacco: Never Used  Substance and Sexual Activity  . Alcohol use: Yes    Alcohol/week: 0.0 oz    Comment: wine with dinner  . Drug use: No  . Sexual activity: Yes    Birth control/protection: Post-menopausal    Comment: 1st intercourse 67 yo-Fewer than 5 partners  Other Topics Concern  . Not on file   Social History Narrative   Exercises regularly    Review of Systems:  All other review of systems negative except as mentioned in the HPI.  Physical Exam: Vital signs in last 24 hours: Temp:  [97.7 F (36.5 C)] 97.7 F (36.5 C) (02/25 0725) Pulse Rate:  [58] 58 (02/25 0725) Resp:  [12] 12 (02/25 0725) BP: (129)/(54) 129/54 (02/25 0725) SpO2:  [100 %] 100 % (02/25 0725) Weight:  [140 lb (63.5 kg)] 140 lb (63.5 kg) (02/25 0725)   General:   Alert,  Well-developed, well-nourished, pleasant and cooperative in NAD Lungs:  Clear throughout to auscultation.   Heart:  Regular rate and rhythm; no murmurs, clicks, rubs,  or gallops. Abdomen:  Soft, nontender and nondistended. Normal bowel sounds.   Neuro/Psych:  Alert and cooperative. Normal mood and affect. A and O x 3   @Lizandra Zakrzewski  Simonne Maffucci, MD, [facility] [phone] (pager) 11/03/2017 8:33 AM@

## 2017-11-03 NOTE — Anesthesia Procedure Notes (Signed)
Date/Time: 11/03/2017 8:38 AM Performed by: Talbot Grumbling, CRNA Oxygen Delivery Method: Simple face mask

## 2017-11-03 NOTE — Discharge Instructions (Signed)
° °  The original polyp is gone - no signs of persistent polyp at the scar.  I found a new tiny polyp and removed it.  I will let you know pathology results and when to have another routine colonoscopy by mail and/or My Chart. Probably in 3 years.  I appreciate the opportunity to care for you. Gatha Mayer, MD, FACG  YOU HAD AN ENDOSCOPIC PROCEDURE TODAY: Refer to the procedure report and other information in the discharge instructions given to you for any specific questions about what was found during the examination. If this information does not answer your questions, please call Dr. Celesta Aver office at [phone] to clarify.   YOU SHOULD EXPECT: Some feelings of bloating in the abdomen. Passage of more gas than usual. Walking can help get rid of the air that was put into your GI tract during the procedure and reduce the bloating. If you had a lower endoscopy (such as a colonoscopy or flexible sigmoidoscopy) you may notice spotting of blood in your stool or on the toilet paper. Some abdominal soreness may be present for a day or two, also.  DIET: Your first meal following the procedure should be a light meal and then it is ok to progress to your normal diet. A half-sandwich or bowl of soup is an example of a good first meal. Heavy or fried foods are harder to digest and may make you feel nauseous or bloated. Drink plenty of fluids but you should avoid alcoholic beverages for 24 hours.   ACTIVITY: Your care partner should take you home directly after the procedure. You should plan to take it easy, moving slowly for the rest of the day. You can resume normal activity the day after the procedure however YOU SHOULD NOT DRIVE, use power tools, machinery or perform tasks that involve climbing or major physical exertion for 24 hours (because of the sedation medicines used during the test).   SYMPTOMS TO REPORT IMMEDIATELY: A gastroenterologist can be reached at any hour. Please call  [phone]  for any of the following symptoms:  Following lower endoscopy (colonoscopy, flexible sigmoidoscopy) Excessive amounts of blood in the stool  Significant tenderness, worsening of abdominal pains  Swelling of the abdomen that is new, acute  Fever of 100 or higher    FOLLOW UP:  If any biopsies were taken you will be contacted by phone or by letter within the next 1-3 weeks. Call [phone]  if you have not heard about the biopsies in 3 weeks.  Please also call with any specific questions about appointments or follow up tests.

## 2017-11-03 NOTE — Anesthesia Preprocedure Evaluation (Addendum)
Anesthesia Evaluation  Patient identified by MRN, date of birth, ID band Patient awake    Reviewed: Allergy & Precautions, NPO status , Patient's Chart, lab work & pertinent test results  History of Anesthesia Complications Negative for: history of anesthetic complications  Airway Mallampati: III  TM Distance: >3 FB Neck ROM: Full    Dental  (+) Teeth Intact   Pulmonary neg pulmonary ROS,    breath sounds clear to auscultation       Cardiovascular negative cardio ROS   Rhythm:Regular     Neuro/Psych negative neurological ROS  negative psych ROS   GI/Hepatic negative GI ROS, Neg liver ROS,   Endo/Other  negative endocrine ROS  Renal/GU negative Renal ROS     Musculoskeletal negative musculoskeletal ROS (+)   Abdominal   Peds  Hematology negative hematology ROS (+)   Anesthesia Other Findings   Reproductive/Obstetrics                             Anesthesia Physical Anesthesia Plan  ASA: I  Anesthesia Plan: MAC   Post-op Pain Management:    Induction: Intravenous  PONV Risk Score and Plan: Treatment may vary due to age or medical condition  Airway Management Planned: Nasal Cannula  Additional Equipment:   Intra-op Plan:   Post-operative Plan:   Informed Consent: I have reviewed the patients History and Physical, chart, labs and discussed the procedure including the risks, benefits and alternatives for the proposed anesthesia with the patient or authorized representative who has indicated his/her understanding and acceptance.   Dental advisory given  Plan Discussed with: CRNA and Surgeon  Anesthesia Plan Comments:         Anesthesia Quick Evaluation

## 2017-11-03 NOTE — Op Note (Signed)
[facility] Patient Name: Catherine [name] Procedure Date: 11/03/2017 MRN: [id number] Attending MD: Gatha Mayer , MD Date of Birth: [birth year] CSN: [id number] Age: 67 Admit Type: Outpatient Procedure:                Colonoscopy Indications:              High risk colon cancer surveillance: Personal                            history of sessile serrated colon polyp (10 mm or                            greater in size) Providers:                Gatha Mayer, MD, Cleda Daub, RN, Elspeth Cho Tech., Technician, Charolette Child,                            Technician, Marla Roe, CRNA Referring MD:              Medicines:                Propofol per Anesthesia, Monitored Anesthesia Care Complications:            No immediate complications. Estimated Blood Loss:     Estimated blood loss was minimal. Procedure:                Pre-Anesthesia Assessment:                           - Prior to the procedure, a History and Physical                            was performed, and patient medications and                            allergies were reviewed. The patient's tolerance of                            previous anesthesia was also reviewed. The risks                            and benefits of the procedure and the sedation                            options and risks were discussed with the patient.                            All questions were answered, and informed consent                            was obtained. Prior Anticoagulants: The patient has  taken no previous anticoagulant or antiplatelet                            agents. ASA Grade Assessment: II - A patient with                            mild systemic disease. After reviewing the risks                            and benefits, the patient was deemed in                            satisfactory condition to undergo the procedure.  After obtaining informed consent, the colonoscope                            was passed under direct vision. Throughout the                            procedure, the patient's blood pressure, pulse, and                            oxygen saturations were monitored continuously. The                            EC-[id number] ([id number]) scope was introduced through                            the anus and advanced to the the cecum, identified                            by appendiceal orifice and ileocecal valve. The                            colonoscopy was technically difficult and complex                            due to significant looping. Successful completion                            of the procedure was aided by withdrawing and                            reinserting the scope. The patient tolerated the                            procedure well. The quality of the bowel                            preparation was excellent. The bowel preparation                            used was Miralax. The ileocecal valve, appendiceal  orifice, and rectum were photographed. Scope In: 8:43:33 AM Scope Out: 9:16:26 AM Scope Withdrawal Time: 0 hours 17 minutes 32 seconds  Total Procedure Duration: 0 hours 32 minutes 53 seconds  Findings:      The perianal and digital rectal examinations were normal.      A diminutive polyp was found in the transverse colon. The polyp was       sessile. The polyp was removed with a cold biopsy forceps. Resection and       retrieval were complete. Verification of patient identification for the       specimen was done. Estimated blood loss was minimal.      A post polypectomy scar was found in the cecum. There was no evidence of       the previous polyp.      The exam was otherwise without abnormality on direct and retroflexion       views. Impression:               - One diminutive polyp in the transverse colon,                            removed  with a cold biopsy forceps. Resected and                            retrieved.                           - Post-polypectomy scar in the cecum.                           - The examination was otherwise normal on direct                            and retroflexion views.                           - Personal history of colonic polyps. Most recently                            > 1 cm ssp in cecum removed 2 and 06/2016 w/                            ablation by APC 06/2016 Moderate Sedation:      N/A- Per Anesthesia Care Recommendation:           - Patient has a contact number available for                            emergencies. The signs and symptoms of potential                            delayed complications were discussed with the                            patient. Return to normal activities tomorrow.  Written discharge instructions were provided to the                            patient.                           - Resume previous diet.                           - Continue present medications.                           - Repeat colonoscopy is recommended for                            surveillance. The colonoscopy date will be                            determined after pathology results from today's                            exam become available for review. Procedure Code(s):        --- Professional ---                           [phone], Colonoscopy, flexible; with biopsy, single                            or multiple Diagnosis Code(s):        --- Professional ---                           Z86.010, Personal history of colonic polyps                           D12.3, Benign neoplasm of transverse colon (hepatic                            flexure or splenic flexure)                           Z98.890, Other specified postprocedural states CPT copyright 2016 American Medical Association. All rights reserved. The codes documented in this report are preliminary and  upon coder review may  be revised to meet current compliance requirements. Gatha Mayer, MD 11/03/2017 9:27:12 AM This report has been signed electronically. Number of Addenda: 0

## 2017-11-04 NOTE — Anesthesia Postprocedure Evaluation (Signed)
Anesthesia Post Note  Patient: Catherine [name]  Procedure(s) Performed: COLONOSCOPY WITH PROPOFOL (N/A ) HOT HEMOSTASIS (ARGON PLASMA COAGULATION/BICAP) (N/A )     Patient location during evaluation: Endoscopy Anesthesia Type: MAC Level of consciousness: awake and alert Pain management: pain level controlled Vital Signs Assessment: post-procedure vital signs reviewed and stable Respiratory status: spontaneous breathing, nonlabored ventilation, respiratory function stable and patient connected to nasal cannula oxygen Cardiovascular status: stable and blood pressure returned to baseline Postop Assessment: no apparent nausea or vomiting Anesthetic complications: no    Last Vitals:  Vitals:   11/03/17 0925 11/03/17 0930  BP:  121/63  Pulse:  62  Resp:  17  Temp:    SpO2: 100% 100%    Last Pain:  Vitals:   11/03/17 0922  TempSrc: Oral                 Baileigh Modisette

## 2017-11-05 ENCOUNTER — Encounter: Payer: Self-pay | Admitting: Internal Medicine

## 2017-11-05 ENCOUNTER — Encounter: Payer: Self-pay | Admitting: Gynecology

## 2017-11-05 ENCOUNTER — Encounter (HOSPITAL_COMMUNITY): Payer: Self-pay | Admitting: Internal Medicine

## 2017-11-05 ENCOUNTER — Ambulatory Visit (INDEPENDENT_AMBULATORY_CARE_PROVIDER_SITE_OTHER): Payer: Medicare Other | Admitting: *Deleted

## 2017-11-05 DIAGNOSIS — M810 Age-related osteoporosis without current pathological fracture: Secondary | ICD-10-CM

## 2017-11-05 MED ORDER — DENOSUMAB 60 MG/ML ~~LOC~~ SOLN
60.0000 mg | Freq: Once | SUBCUTANEOUS | Status: AC
  Administered 2017-11-05: 60 mg via SUBCUTANEOUS

## 2017-11-05 NOTE — Progress Notes (Signed)
No adenomas/ssp's No true polyps 3 year colon recall My Chart letter

## 2017-11-05 NOTE — Telephone Encounter (Signed)
Prolia given 11/05/17 Next injection 05/06/18

## 2018-04-08 ENCOUNTER — Telehealth: Payer: Self-pay | Admitting: *Deleted

## 2018-04-08 NOTE — Telephone Encounter (Signed)
Deductible Amount Met  OOP MAX $4000 ($31mt)  Annual exam 10/21/17 TF  Calcium  9.3         Date 10/30/17  Upcoming dental procedures NO  Prior Authorization needed NO  Pt estimated Cost   $90  Appt 05/06/18      Coverage Details: $50 one dose, $40 admin fee

## 2018-05-06 ENCOUNTER — Ambulatory Visit (INDEPENDENT_AMBULATORY_CARE_PROVIDER_SITE_OTHER): Payer: Medicare Other | Admitting: Gynecology

## 2018-05-06 DIAGNOSIS — M810 Age-related osteoporosis without current pathological fracture: Secondary | ICD-10-CM

## 2018-05-06 MED ORDER — DENOSUMAB 60 MG/ML ~~LOC~~ SOSY
60.0000 mg | PREFILLED_SYRINGE | Freq: Once | SUBCUTANEOUS | Status: AC
Start: 2018-05-06 — End: 2018-05-06
  Administered 2018-05-06: 60 mg via SUBCUTANEOUS

## 2018-05-07 NOTE — Telephone Encounter (Signed)
PROLIA GIVEN 05/06/18 NEXT INJECTION 11/08/2018

## 2018-06-08 ENCOUNTER — Encounter: Payer: Self-pay | Admitting: Gynecology

## 2018-06-30 ENCOUNTER — Ambulatory Visit (INDEPENDENT_AMBULATORY_CARE_PROVIDER_SITE_OTHER): Payer: Medicare Other

## 2018-06-30 DIAGNOSIS — Z23 Encounter for immunization: Secondary | ICD-10-CM | POA: Diagnosis not present

## 2018-09-04 ENCOUNTER — Other Ambulatory Visit: Payer: Self-pay | Admitting: Gynecology

## 2018-10-15 LAB — HM MAMMOGRAPHY

## 2018-10-22 ENCOUNTER — Telehealth: Payer: Self-pay | Admitting: *Deleted

## 2018-10-22 NOTE — Telephone Encounter (Signed)
Prolia insurance verification has been sent awaiting Summary of benefits  

## 2018-10-26 ENCOUNTER — Encounter: Payer: Self-pay | Admitting: Internal Medicine

## 2018-11-10 NOTE — Telephone Encounter (Addendum)
Deductible Amount Met  OOP MAX $4000 (67mt)  Annual exam  11/17/2018 TF  Calcium   9.2          Date 11/17/2018  Upcoming dental procedures   Prior Authorization needed NO  Pt estimated Cost $70  Appt 01/07/2019 at 10:00  Pt states she will call me back to schedule Prolia.   Coverage Details: $50 one dose and $20 admin fee

## 2018-11-17 ENCOUNTER — Encounter: Payer: Self-pay | Admitting: Gynecology

## 2018-11-17 ENCOUNTER — Ambulatory Visit (INDEPENDENT_AMBULATORY_CARE_PROVIDER_SITE_OTHER): Payer: Medicare Other | Admitting: Gynecology

## 2018-11-17 VITALS — BP 120/74 | Ht 67.0 in | Wt 143.0 lb

## 2018-11-17 DIAGNOSIS — N952 Postmenopausal atrophic vaginitis: Secondary | ICD-10-CM | POA: Diagnosis not present

## 2018-11-17 DIAGNOSIS — M810 Age-related osteoporosis without current pathological fracture: Secondary | ICD-10-CM

## 2018-11-17 DIAGNOSIS — Z01419 Encounter for gynecological examination (general) (routine) without abnormal findings: Secondary | ICD-10-CM | POA: Diagnosis not present

## 2018-11-17 LAB — CALCIUM: Calcium: 9.2 mg/dL (ref 8.6–10.4)

## 2018-11-17 NOTE — Patient Instructions (Signed)
Follow-up for Prolia as arranged.

## 2018-11-17 NOTE — Progress Notes (Signed)
    Catherine [name] Feb 24, 1951 [id number]        68 y.o.  G0P0 for annual gynecologic exam.  Without gynecologic complaints.  Past medical history,surgical history, problem list, medications, allergies, family history and social history were all reviewed and documented as reviewed in the EPIC chart.  ROS:  Performed with pertinent positives and negatives included in the history, assessment and plan.   Additional significant findings : None   Exam: Caryn Bee assistant Vitals:   11/17/18 0844  BP: 120/74  Weight: 143 lb (64.9 kg)  Height: 5\' 7"  (1.702 m)   Body mass index is 22.4 kg/m.  General appearance:  Normal affect, orientation and appearance. Skin: Grossly normal HEENT: Without gross lesions.  No cervical or supraclavicular adenopathy. Thyroid normal.  Lungs:  Clear without wheezing, rales or rhonchi Cardiac: RR, without RMG Abdominal:  Soft, nontender, without masses, guarding, rebound, organomegaly or hernia Breasts:  Examined lying and sitting without masses, retractions, discharge or axillary adenopathy. Pelvic:  Ext, BUS, Vagina: With atrophic changes   Cervix: With atrophic changes  Uterus: Anteverted, normal size, shape and contour, midline and mobile nontender   Adnexa: Without masses or tenderness    Anus and perineum: Normal   Rectovaginal: Normal sphincter tone without palpated masses or tenderness.    Assessment/Plan:  68 y.o. G0P0 female for annual gynecologic exam.   1. Postmenopausal.  No significant menopausal symptoms or any vaginal bleeding. 2. Osteoporosis.  DEXA 2019 T score -2.9.  Previously -3.5.  Starting third year of Prolia.  Check calcium level today and follow-up for Prolia shot as arranged.  Plan on repeating DEXA next year at 2-year interval.  Patient did relate thinking she had one done earlier this year at [facility] and will check about that. 3. Colonoscopy 2019.  Repeat at their recommended interval. 4. Pap smear 2019.  No Pap smear done  today.  Options to stop screening per current screening guidelines reviewed.  Will readdress on annual basis.  No history of significant abnormal Pap smears. 5. Mammography 10/2018.  Continue with annual mammography when due.  Breast exam normal today. 6. Health maintenance.  No routine lab work done as patient does this elsewhere.  Follow-up 1 year, sooner as needed.   Anastasio Auerbach MD, 9:07 AM 11/17/2018

## 2018-11-29 ENCOUNTER — Emergency Department (HOSPITAL_COMMUNITY): Payer: Medicare Other

## 2018-11-29 ENCOUNTER — Encounter (HOSPITAL_COMMUNITY): Payer: Self-pay

## 2018-11-29 ENCOUNTER — Other Ambulatory Visit: Payer: Self-pay

## 2018-11-29 ENCOUNTER — Emergency Department (HOSPITAL_COMMUNITY)
Admission: EM | Admit: 2018-11-29 | Discharge: 2018-11-29 | Disposition: A | Discharge status: Home / Self Care | Payer: Medicare Other | Attending: Emergency Medicine | Admitting: Emergency Medicine

## 2018-11-29 DIAGNOSIS — Z91040 Latex allergy status: Secondary | ICD-10-CM | POA: Insufficient documentation

## 2018-11-29 DIAGNOSIS — R079 Chest pain, unspecified: Secondary | ICD-10-CM | POA: Diagnosis present

## 2018-11-29 DIAGNOSIS — R0789 Other chest pain: Secondary | ICD-10-CM | POA: Diagnosis not present

## 2018-11-29 DIAGNOSIS — Z79899 Other long term (current) drug therapy: Secondary | ICD-10-CM | POA: Insufficient documentation

## 2018-11-29 LAB — HEPATIC FUNCTION PANEL
ALT: 16 U/L (ref 0–44)
AST: 24 U/L (ref 15–41)
Albumin: 4.3 g/dL (ref 3.5–5.0)
Alkaline Phosphatase: 55 U/L (ref 38–126)
BILIRUBIN INDIRECT: 0.7 mg/dL (ref 0.3–0.9)
Bilirubin, Direct: 0.1 mg/dL (ref 0.0–0.2)
Total Bilirubin: 0.8 mg/dL (ref 0.3–1.2)
Total Protein: 7.5 g/dL (ref 6.5–8.1)

## 2018-11-29 LAB — BASIC METABOLIC PANEL
Anion gap: 10 (ref 5–15)
BUN: 12 mg/dL (ref 8–23)
CALCIUM: 9 mg/dL (ref 8.9–10.3)
CHLORIDE: 104 mmol/L (ref 98–111)
CO2: 25 mmol/L (ref 22–32)
CREATININE: 0.81 mg/dL (ref 0.44–1.00)
GFR calc Af Amer: >60 mL/min (ref >60)
GFR calc non Af Amer: >60 mL/min (ref >60)
Glucose, Bld: 97 mg/dL (ref 70–99)
Potassium: 3.5 mmol/L (ref 3.5–5.1)
Sodium: 139 mmol/L (ref 135–145)

## 2018-11-29 LAB — CBC
HEMATOCRIT: 41.7 % (ref 36.0–46.0)
Hemoglobin: 13.5 g/dL (ref 12.0–15.0)
MCH: 32.7 pg (ref 26.0–34.0)
MCHC: 32.4 g/dL (ref 30.0–36.0)
MCV: 101 fL — AB (ref 80.0–100.0)
NRBC: 0 % (ref 0.0–0.2)
PLATELETS: 294 10*3/uL (ref 150–400)
RBC: 4.13 MIL/uL (ref 3.87–5.11)
RDW: 12.9 % (ref 11.5–15.5)
WBC: 6.6 10*3/uL (ref 4.0–10.5)

## 2018-11-29 LAB — LIPASE, BLOOD: Lipase: 40 U/L (ref 11–51)

## 2018-11-29 LAB — I-STAT TROPONIN, ED
Troponin i, poc: 0 ng/mL (ref 0.00–0.08)
Troponin i, poc: 0 ng/mL (ref 0.00–0.08)

## 2018-11-29 LAB — D-DIMER, QUANTITATIVE: D-Dimer, Quant: <0.27 ug/mL-FEU (ref 0.00–0.50)

## 2018-11-29 MED ORDER — NITROGLYCERIN 0.4 MG SL SUBL
0.4000 mg | SUBLINGUAL_TABLET | SUBLINGUAL | Status: DC | PRN
  Administered 2018-11-29: 0.4 mg via SUBLINGUAL
  Filled 2018-11-29: qty 1

## 2018-11-29 MED ORDER — IOHEXOL 300 MG/ML  SOLN
75.0000 mL | Freq: Once | INTRAMUSCULAR | Status: AC | PRN
  Administered 2018-11-29: 75 mL via INTRAVENOUS

## 2018-11-29 MED ORDER — SODIUM CHLORIDE (PF) 0.9 % IJ SOLN
INTRAMUSCULAR | Status: AC
  Filled 2018-11-29: qty 50

## 2018-11-29 NOTE — ED Provider Notes (Signed)
[facility] DEPT Provider Note   CSN: [id number] Arrival date & time: 11/29/18  0602    History   Chief Complaint Chief Complaint  Patient presents with   Chest Pain    HPI Catherine [name] is a 68 y.o. female presenting for evaluation of chest pain.  Patient states at 3:00 this morning she was woken from sleep with chest pain.  It is mostly substernal and towards the right.  It is constant, worse when she takes a deep breath in.  She describes it as a pressure pain.  She tried stretching, waiting, changing position, and aspirin without improvement of her symptoms.  She denies history of similar.  She denies fevers, chills, shortness of breath, cough, nausea, vomiting, abdominal pain, urinary symptoms, normal bowel movements.  She denies tobacco or drug use.  She drinks alcohol socially, had a few glasses of wine last night.  She denies recent travel, surgeries, immobilization, trauma, history of cancer, history of previous DVT/PE, or hormone use.  She denies family history of CAD.  Patient states she has no medical problems, takes only vitamins daily.  No history of diabetes, hypertension, PAD.  She denies history of GERD.      HPI  Past Medical History:  Diagnosis Date   Colon polyp 2017   Osteoporosis 10/2017   T score -2.9    Patient Active Problem List   Diagnosis Date Noted   Thyroid antibody positive 11/10/2016   Elevated TSH 08/29/2016   Osteoporosis 08/16/2015   History of colonic polyps 07/04/2012   HYPERCHOLESTEROLEMIA 05/06/2008    Past Surgical History:  Procedure Laterality Date   COLONOSCOPY     COLONOSCOPY WITH PROPOFOL N/A 06/17/2016   Procedure: COLONOSCOPY WITH PROPOFOL;  Surgeon: Gatha Mayer, MD;  Location: [facility];  Service: Endoscopy;  Laterality: N/A;   COLONOSCOPY WITH PROPOFOL N/A 11/03/2017   Procedure: COLONOSCOPY WITH PROPOFOL;  Surgeon: Gatha Mayer, MD;  Location: [facility];  Service:  Endoscopy;  Laterality: N/A;   HOT HEMOSTASIS N/A 06/17/2016   Procedure: HOT HEMOSTASIS (ARGON PLASMA COAGULATION/BICAP);  Surgeon: Gatha Mayer, MD;  Location: [facility];  Service: Endoscopy;  Laterality: N/A;   HOT HEMOSTASIS N/A 11/03/2017   Procedure: HOT HEMOSTASIS (ARGON PLASMA COAGULATION/BICAP);  Surgeon: Gatha Mayer, MD;  Location: [facility];  Service: Endoscopy;  Laterality: N/A;   POLYPECTOMY       OB History    Gravida  0   Para      Term      Preterm      AB      Living        SAB      TAB      Ectopic      Multiple      Live Births               Home Medications    Prior to Admission medications   Medication Sig Start Date End Date Taking? Authorizing Provider  calcium gluconate 500 MG tablet Take 500 mg by mouth daily.   Yes [provider]  Cholecalciferol (VITAMIN D) 1000 UNITS capsule Take 1,000 Units by mouth daily.     Yes [provider]  CRANBERRY PO Take 300 mg by mouth daily.    Yes [provider]  denosumab (PROLIA) 60 MG/ML SOLN injection Inject 60 mg into the skin every 6 (six) months. Administer in upper arm, thigh, or abdomen   Yes [provider]  fluocinonide (LIDEX) 0.05 % external solution Apply 1 application topically daily.  06/16/18  Yes [provider]  glucosamine-chondroitin 500-400 MG tablet Take 1 tablet by mouth daily.     Yes [provider]  Multiple Vitamin (MULTIVITAMIN) capsule Take 1 capsule by mouth daily.     Yes [provider]  Omega-3 Fatty Acids (FISH OIL) 1000 MG CAPS Take 1,000 mg by mouth daily.    Yes [provider]  Turmeric 500 MG CAPS Take 500 mg by mouth daily.    Yes [provider]    Family History Family History  Problem Relation Age of Onset   Hypothyroidism Mother    Thyroid disease Mother    Lung cancer Father        smoker   Thyroid disease Sister    Thyroid disease Sister    Thyroid  disease Sister    Kidney Stones Sister    Colon cancer Neg Hx    Colon polyps Neg Hx    Esophageal cancer Neg Hx    Rectal cancer Neg Hx    Stomach cancer Neg Hx     Social History Social History   Tobacco Use   Smoking status: Never Smoker   Smokeless tobacco: Never Used  Substance Use Topics   Alcohol use: Yes    Alcohol/week: 0.0 standard drinks    Comment: wine with dinner   Drug use: No     Allergies   Oysters [shellfish allergy]; Avocado; and Latex   Review of Systems Review of Systems  Cardiovascular: Positive for chest pain.  All other systems reviewed and are negative.    Physical Exam Updated Vital Signs BP 130/64    Pulse (!) 53    Temp 98 F (36.7 C) (Oral)    Resp 12    Ht 5\' 7"  (1.702 m)    Wt 63.5 kg    LMP 09/09/1996    SpO2 100%    BMI 21.93 kg/m   Physical Exam Vitals signs and nursing note reviewed.  Constitutional:      General: She is not in acute distress.    Appearance: She is well-developed.     Comments: Resting comfortably in the bed in NAD  HENT:     Head: Normocephalic and atraumatic.  Eyes:     Conjunctiva/sclera: Conjunctivae normal.     Pupils: Pupils are equal, round, and reactive to light.  Neck:     Musculoskeletal: Normal range of motion and neck supple.  Cardiovascular:     Rate and Rhythm: Normal rate and regular rhythm.     Pulses: Normal pulses.  Pulmonary:     Effort: Pulmonary effort is normal. No respiratory distress.     Breath sounds: Normal breath sounds. No wheezing.     Comments: Speaking in full sentences. Clear lung sounds in all fields. No increased pain with palpation, but pain is located substernal/xiphoid and to the R Chest:     Chest wall: No tenderness.    Abdominal:     General: There is no distension.     Palpations: Abdomen is soft. There is no mass.     Tenderness: There is no abdominal tenderness. There is no guarding or rebound.     Comments: No ttp of the abd. Soft without  rigidity, guarding, distention. Negative rebound. Negative murphys.   Musculoskeletal: Normal range of motion.     Right lower leg: No edema.     Left lower leg: No edema.  Comments: No ttp of lower extremities. No edema.   Skin:    General: Skin is warm and dry.     Capillary Refill: Capillary refill takes less than 2 seconds.  Neurological:     Mental Status: She is alert and oriented to person, place, and time.      ED Treatments / Results  Labs (all labs ordered are listed, but only abnormal results are displayed) Labs Reviewed  CBC - Abnormal; Notable for the following components:      Result Value   MCV 101.0 (*)    All other components within normal limits  BASIC METABOLIC PANEL  D-DIMER, QUANTITATIVE (NOT AT [facility])  HEPATIC FUNCTION PANEL  LIPASE, BLOOD  I-STAT TROPONIN, ED  I-STAT TROPONIN, ED    EKG EKG Interpretation  Date/Time:  Sunday November 29 2018 06:13:35 EDT Ventricular Rate:  60 PR Interval:    QRS Duration: 106 QT Interval:  425 QTC Calculation: 425 R Axis:   70 Text Interpretation:  Sinus rhythm RSR' in V1 or V2, probably normal variant No old tracing to compare Confirmed by Merrily Pew [phone]) on 11/29/2018 6:53:31 AM   Radiology Dg Chest 2 View  Result Date: 11/29/2018 CLINICAL DATA:  68 year old female with history of pain in the right-side of the chest radiating to the right shoulder. EXAM: CHEST - 2 VIEW COMPARISON:  No priors. FINDINGS: Lung volumes are normal. No consolidative airspace disease. No pleural effusions. No pneumothorax. There are some ill-defined apical opacities bilaterally, favored to reflect areas of chronic pleuroparenchymal scarring (right greater than left). No pulmonary nodule or mass noted. Pulmonary vasculature and the cardiomediastinal silhouette are within normal limits. IMPRESSION: 1. No radiographic evidence of acute cardiopulmonary disease. 2. Probable bilateral apical pleuroparenchymal scarring, likely from prior  infections. This is slightly asymmetric involving the right apex greater than the left. Given the patient's history of right-sided chest pain radiating into the shoulder, the possibility of underlying neoplasm in this area is not entirely excluded. Although this is not strongly favored on the basis of this plain film examination, if there is clinical concern, further evaluation with chest CT (preferably with IV contrast) could be considered in the near future. Electronically Signed   By: Vinnie Langton M.D.   On: 11/29/2018 06:54   Ct Chest W Contrast  Result Date: 11/29/2018 CLINICAL DATA:  Evaluate apical density seen on recent chest x-ray. Right chest and shoulder pain. EXAM: CT CHEST WITH CONTRAST TECHNIQUE: Multidetector CT imaging of the chest was performed during intravenous contrast administration. CONTRAST:  43mL OMNIPAQUE IOHEXOL 300 MG/ML  SOLN COMPARISON:  Chest x-ray today. FINDINGS: Cardiovascular: Heart is normal size. Thoracic aorta is normal in caliber. Minimal calcified plaque over the thoracic aorta. Pulmonary arterial system is within normal. Remaining vascular structures are unremarkable. Mediastinum/Nodes: No mediastinal or hilar adenopathy. Remaining mediastinal structures are normal. Lungs/Pleura: Lungs are well inflated without focal airspace consolidation or effusion. There is biapical pleural thickening right greater than left. No focal mass visualized over the lung apices, although there is mild focal nodularity over the right apical pleural thickening measuring approximately 1.3 cm. This is likely pleuroparenchymal scarring from previous inflammatory/infectious process and not developing malignancy. Small focus of pleural calcification over the left apex. Airways are normal. Upper Abdomen: Calcified plaque over the abdominal aorta. No acute findings. Musculoskeletal: Unremarkable. IMPRESSION: No acute cardiopulmonary disease. Biapical pleural thickening right greater than left with  subtle 1.3 cm focus of nodularity over the right apex likely part of this pleuroparenchymal  scarring and much less likely developing malignancy. Recommend a six-month follow-up noncontrast chest CT to document stability. Aortic Atherosclerosis (ICD10-I70.0). Electronically Signed   By: Marin Olp M.D.   On: 11/29/2018 08:12    Procedures Procedures (including critical care time)  Medications Ordered in ED Medications  nitroGLYCERIN (NITROSTAT) SL tablet 0.4 mg (0.4 mg Sublingual Given 11/29/18 0704)  sodium chloride (PF) 0.9 % injection (has no administration in time range)  iohexol (OMNIPAQUE) 300 MG/ML solution 75 mL (75 mLs Intravenous Contrast Given 11/29/18 0744)     Initial Impression / Assessment and Plan / ED Course  I have reviewed the triage vital signs and the nursing notes.  Pertinent labs & imaging results that were available during my care of the patient were reviewed by me and considered in my medical decision making (see chart for details).        Pt presenting for evaluation of CP. Physical exam reassuring, she appears nontoxic. No risk factors for ACS or PE other than age. Will order CP workup and abd labs- consider gallbladder pain, msk pain, pe (increased pain with deep breaths), acs (CP).   Initial work-up reassuring.  No leukocytosis.  Troponin negative.  D-dimer negative.  EKG without STEMI.  Chest x-ray viewed interpreted by me, no pneumonia, pneumothorax, effusion, cardiomegaly.  Per radiology, there is what appears to be old scarring of bilateral lung apices, worse on the right.  Recommend CT to rule out underlying malignancy.  Will order a CT with contrast.  Case discussed with attending, Dr. Dayna Barker evaluated the patient.  On reassessment, patient reports pain is resolved with nitro after 1 dose.  CT pending.  CT shows thickening of the pleura, but not consistent with malignancy, radiology favor scarring.  Recommends follow-up CT in several months.  Delta  troponin negative, and pt with heart score 2, low risk. Strongly doubt ACS.  Discussed findings with patient, and importance of follow-up with PCP.  Strict return precautions given.  Pt remains pain free. At this time, patient appears safe for discharge.  Return precautions given.  Patient states she understands and agrees plan.   Final Clinical Impressions(s) / ED Diagnoses   Final diagnoses:  Atypical chest pain    ED Discharge Orders    None       Franchot Heidelberg, PA-C 11/29/18 1002    Mesner, Corene Cornea, MD 11/29/18 2256

## 2018-11-29 NOTE — Discharge Instructions (Addendum)
Your work-up today was reassuring. The CT showed what looks like scarring of your lungs.  You should discuss this with your primary care doctor, and see if there is a need for repeat imaging to ensure there is no malignancy. Follow-up with your primary care doctor if your pain returns. Return to the emergency room if you develop persistent pain, difficulty breathing, clamminess/sweatiness/weakness, or with any new, worsening, or concerning symptoms.

## 2018-11-29 NOTE — ED Triage Notes (Signed)
Patient coming from home with complaints of chest pain that started around 0330. Patient states that she thought she was sleeping in an odd position, she she attempted to stretch and deep breathe but the pain did not go away. She also tried to take a baby aspirin with no relief.   Pain is in right side of chest and radiates to right shoulder. Patient endorses right hand tingling, stating "my hand just doesn't feel like my hand." Denies N/V and SOB.

## 2018-11-29 NOTE — ED Notes (Signed)
Bed: WA19 Expected date:  Expected time:  Means of arrival:  Comments: 

## 2018-11-29 NOTE — ED Provider Notes (Signed)
Medical screening examination/treatment/procedure(s) were conducted as a shared visit with non-physician practitioner(s) and myself.  I personally evaluated the patient during the encounter.  Subxiphoid pain without GI symptoms. Also with R shoulder discomfort and tingling in right arm but states it could have been from sleeping on it wrong.  Unlikely cva without objective findings. Possibly peripheral palsy? Doubt gerd without history and so long after last meal. PE? ACS? Would eval with ecg/labs/xr/d dimer/delta troponin. Treat symptomatic.   EKG Interpretation  Date/Time:  Sunday November 29 2018 06:13:35 EDT Ventricular Rate:  60 PR Interval:    QRS Duration: 106 QT Interval:  425 QTC Calculation: 425 R Axis:   70 Text Interpretation:  Sinus rhythm RSR' in V1 or V2, probably normal variant No old tracing to compare Confirmed by Merrily Pew [id number]) on 11/29/2018 6:53:31 AM      [place] Whed[place], Corene Cornea, MD 11/29/18 [phone]

## 2019-01-07 ENCOUNTER — Ambulatory Visit (INDEPENDENT_AMBULATORY_CARE_PROVIDER_SITE_OTHER): Payer: Medicare Other | Admitting: Gynecology

## 2019-01-07 ENCOUNTER — Other Ambulatory Visit: Payer: Self-pay

## 2019-01-07 DIAGNOSIS — M810 Age-related osteoporosis without current pathological fracture: Secondary | ICD-10-CM | POA: Diagnosis not present

## 2019-01-07 MED ORDER — DENOSUMAB 60 MG/ML ~~LOC~~ SOSY
60.0000 mg | PREFILLED_SYRINGE | Freq: Once | SUBCUTANEOUS | Status: AC
  Administered 2019-01-07: 60 mg via SUBCUTANEOUS

## 2019-01-27 NOTE — Telephone Encounter (Signed)
Prolia given 01/07/2019 Next injection 07/10/2019

## 2019-06-14 ENCOUNTER — Telehealth: Payer: Self-pay | Admitting: *Deleted

## 2019-06-14 NOTE — Telephone Encounter (Signed)
Deductible N/A  OOP MAX [id number])  Annual exam 11/17/2018 TF  Calcium 9.0             Date 11/29/2018  Upcoming dental procedures   Prior Authorization needed NO  Pt estimated Cost $90   APPT 07/12/2019@ 11:00    Coverage Details: $50 ONE DOSE,$40 ADMIN FEE

## 2019-06-16 ENCOUNTER — Encounter: Payer: Self-pay | Admitting: Gynecology

## 2019-06-22 NOTE — Patient Instructions (Addendum)
Tests ordered today. Your results will be released to MyChart (or called to you) after review.  If any changes need to be made, you will be notified at that same time.  All other Health Maintenance issues reviewed.   All recommended immunizations and age-appropriate screenings are up-to-date or discussed.  Flu immunization administered today.    Medications reviewed and updated.  Changes include :   none    Please followup in 1 year     Health Maintenance, Female Adopting a healthy lifestyle and getting preventive care are important in promoting health and wellness. Ask your health care provider about:  The right schedule for you to have regular tests and exams.  Things you can do on your own to prevent diseases and keep yourself healthy. What should I know about diet, weight, and exercise? Eat a healthy diet   Eat a diet that includes plenty of vegetables, fruits, low-fat dairy products, and lean protein.  Do not eat a lot of foods that are high in solid fats, added sugars, or sodium. Maintain a healthy weight Body mass index (BMI) is used to identify weight problems. It estimates body fat based on height and weight. Your health care provider can help determine your BMI and help you achieve or maintain a healthy weight. Get regular exercise Get regular exercise. This is one of the most important things you can do for your health. Most adults should:  Exercise for at least 150 minutes each week. The exercise should increase your heart rate and make you sweat (moderate-intensity exercise).  Do strengthening exercises at least twice a week. This is in addition to the moderate-intensity exercise.  Spend less time sitting. Even light physical activity can be beneficial. Watch cholesterol and blood lipids Have your blood tested for lipids and cholesterol at 69 years of age, then have this test every 5 years. Have your cholesterol levels checked more often if:  Your lipid or  cholesterol levels are high.  You are older than 69 years of age.  You are at high risk for heart disease. What should I know about cancer screening? Depending on your health history and family history, you may need to have cancer screening at various ages. This may include screening for:  Breast cancer.  Cervical cancer.  Colorectal cancer.  Skin cancer.  Lung cancer. What should I know about heart disease, diabetes, and high blood pressure? Blood pressure and heart disease  High blood pressure causes heart disease and increases the risk of stroke. This is more likely to develop in people who have high blood pressure readings, are of African descent, or are overweight.  Have your blood pressure checked: ? Every 3-5 years if you are 18-39 years of age. ? Every year if you are 40 years old or older. Diabetes Have regular diabetes screenings. This checks your fasting blood sugar level. Have the screening done:  Once every three years after age 40 if you are at a normal weight and have a low risk for diabetes.  More often and at a younger age if you are overweight or have a high risk for diabetes. What should I know about preventing infection? Hepatitis B If you have a higher risk for hepatitis B, you should be screened for this virus. Talk with your health care provider to find out if you are at risk for hepatitis B infection. Hepatitis C Testing is recommended for:  Everyone born from 1945 through 1965.  Anyone with known risk factors for   hepatitis C. Sexually transmitted infections (STIs)  Get screened for STIs, including gonorrhea and chlamydia, if: ? You are sexually active and are younger than 69 years of age. ? You are older than 69 years of age and your health care provider tells you that you are at risk for this type of infection. ? Your sexual activity has changed since you were last screened, and you are at increased risk for chlamydia or gonorrhea. Ask your  health care provider if you are at risk.  Ask your health care provider about whether you are at high risk for HIV. Your health care provider may recommend a prescription medicine to help prevent HIV infection. If you choose to take medicine to prevent HIV, you should first get tested for HIV. You should then be tested every 3 months for as long as you are taking the medicine. Pregnancy  If you are about to stop having your period (premenopausal) and you may become pregnant, seek counseling before you get pregnant.  Take 400 to 800 micrograms (mcg) of folic acid every day if you become pregnant.  Ask for birth control (contraception) if you want to prevent pregnancy. Osteoporosis and menopause Osteoporosis is a disease in which the bones lose minerals and strength with aging. This can result in bone fractures. If you are 65 years old or older, or if you are at risk for osteoporosis and fractures, ask your health care provider if you should:  Be screened for bone loss.  Take a calcium or vitamin D supplement to lower your risk of fractures.  Be given hormone replacement therapy (HRT) to treat symptoms of menopause. Follow these instructions at home: Lifestyle  Do not use any products that contain nicotine or tobacco, such as cigarettes, e-cigarettes, and chewing tobacco. If you need help quitting, ask your health care provider.  Do not use street drugs.  Do not share needles.  Ask your health care provider for help if you need support or information about quitting drugs. Alcohol use  Do not drink alcohol if: ? Your health care provider tells you not to drink. ? You are pregnant, may be pregnant, or are planning to become pregnant.  If you drink alcohol: ? Limit how much you use to 0-1 drink a day. ? Limit intake if you are breastfeeding.  Be aware of how much alcohol is in your drink. In the [place] one drink equals one 12 oz bottle of beer (355 mL), one 5 oz glass of wine (148  mL), or one 1 oz glass of hard liquor (44 mL). General instructions  Schedule regular health, dental, and eye exams.  Stay current with your vaccines.  Tell your health care provider if: ? You often feel depressed. ? You have ever been abused or do not feel safe at home. Summary  Adopting a healthy lifestyle and getting preventive care are important in promoting health and wellness.  Follow your health care provider's instructions about healthy diet, exercising, and getting tested or screened for diseases.  Follow your health care provider's instructions on monitoring your cholesterol and blood pressure. This information is not intended to replace advice given to you by your health care provider. Make sure you discuss any questions you have with your health care provider. Document Released: 03/11/2011 Document Revised: 08/19/2018 Document Reviewed: 08/19/2018 Elsevier Patient Education  2020 [employer]    

## 2019-06-22 NOTE — Progress Notes (Signed)
Subjective:    Patient ID: Catherine [name], female    DOB: [birth year], 68 y.o.   MRN: [id number]  HPI She is here for a physical exam.   Overall,  she is doing well and has no major concerns.  She has had increased stress over the past year due to a close friend dying and her being the executor of her will.  She knows she has been under more stress, but has recognized that and has good support.    Medications and allergies reviewed with patient and updated if appropriate.  Patient Active Problem List   Diagnosis Date Noted  . Thyroid antibody positive 11/10/2016  . Osteoporosis 08/16/2015  . History of colonic polyps 07/04/2012  . HYPERCHOLESTEROLEMIA 05/06/2008    Current Outpatient Medications on File Prior to Visit  Medication Sig Dispense Refill  . calcium gluconate 500 MG tablet Take 500 mg by mouth daily.    . Cholecalciferol (VITAMIN D) 1000 UNITS capsule Take 1,000 Units by mouth daily.      Marland Kitchen denosumab (PROLIA) 60 MG/ML SOLN injection Inject 60 mg into the skin every 6 (six) months. Administer in upper arm, thigh, or abdomen    . fluocinonide (LIDEX) 0.05 % external solution Apply 1 application topically daily.   1  . glucosamine-chondroitin 500-400 MG tablet Take 1 tablet by mouth daily.      . Multiple Vitamin (MULTIVITAMIN) capsule Take 1 capsule by mouth daily.      . Omega-3 Fatty Acids (FISH OIL) 1000 MG CAPS Take 1,000 mg by mouth daily.     . Turmeric 500 MG CAPS Take 500 mg by mouth daily.      No current facility-administered medications on file prior to visit.     Past Medical History:  Diagnosis Date  . Colon polyp 2017  . Osteoporosis 10/2017   T score -2.9    Past Surgical History:  Procedure Laterality Date  . COLONOSCOPY    . COLONOSCOPY WITH PROPOFOL N/A 06/17/2016   Procedure: COLONOSCOPY WITH PROPOFOL;  Surgeon: Gatha Mayer, MD;  Location: [facility];  Service: Endoscopy;  Laterality: N/A;  . COLONOSCOPY WITH PROPOFOL N/A 11/03/2017   Procedure: COLONOSCOPY WITH PROPOFOL;  Surgeon: Gatha Mayer, MD;  Location: [facility];  Service: Endoscopy;  Laterality: N/A;  . HOT HEMOSTASIS N/A 06/17/2016   Procedure: HOT HEMOSTASIS (ARGON PLASMA COAGULATION/BICAP);  Surgeon: Gatha Mayer, MD;  Location: [facility];  Service: Endoscopy;  Laterality: N/A;  . HOT HEMOSTASIS N/A 11/03/2017   Procedure: HOT HEMOSTASIS (ARGON PLASMA COAGULATION/BICAP);  Surgeon: Gatha Mayer, MD;  Location: [facility];  Service: Endoscopy;  Laterality: N/A;  . POLYPECTOMY      Social History   Socioeconomic History  . Marital status: Married    Spouse name: Not on file  . Number of children: Not on file  . Years of education: Not on file  . Highest education level: Not on file  Occupational History  . Occupation: retired  Scientific laboratory technician  . Financial resource strain: Not hard at all  . Food insecurity    Worry: Never true    Inability: Never true  . Transportation needs    Medical: No    Non-medical: No  Tobacco Use  . Smoking status: Never Smoker  . Smokeless tobacco: Never Used  Substance and Sexual Activity  . Alcohol use: Yes    Alcohol/week: 0.0 standard drinks    Comment: wine with dinner  . Drug use: No  .  Sexual activity: Yes    Birth control/protection: Post-menopausal    Comment: 1st intercourse 68 yo-Fewer than 5 partners  Lifestyle  . Physical activity    Days per week: 4 days    Minutes per session: 50 min  . Stress: Only a little  Relationships  . Social connections    Talks on phone: More than three times a week    Gets together: More than three times a week    Attends religious service: More than 4 times per year    Active member of club or organization: Not on file    Attends meetings of clubs or organizations: More than 4 times per year    Relationship status: Married  Other Topics Concern  . Not on file  Social History Narrative   Exercises regularly    Family History  Problem Relation Age of  Onset  . Hypothyroidism Mother   . Thyroid disease Mother   . Lung cancer Father        smoker  . Thyroid disease Sister   . Thyroid disease Sister   . Thyroid disease Sister   . Kidney Stones Sister   . Colon cancer Neg Hx   . Colon polyps Neg Hx   . Esophageal cancer Neg Hx   . Rectal cancer Neg Hx   . Stomach cancer Neg Hx     Review of Systems  Constitutional: Negative for chills and fever.  Eyes: Negative for visual disturbance.  Respiratory: Negative for cough, shortness of breath and wheezing.   Cardiovascular: Negative for chest pain, palpitations and leg swelling.  Gastrointestinal: Negative for abdominal pain, blood in stool, constipation, diarrhea and nausea.       No gerd  Genitourinary: Positive for frequency. Negative for dysuria and hematuria.       Mild cloudy urine  Musculoskeletal: Negative for arthralgias and back pain.  Skin: Negative for color change and rash.  Neurological: Negative for light-headedness and headaches.  Psychiatric/Behavioral: Negative for dysphoric mood. The patient is not nervous/anxious.        Objective:   Vitals:   06/23/19 0824  BP: 112/68  Pulse: 65  Resp: 16  Temp: 98 F (36.7 C)  SpO2: 99%   Filed Weights   06/23/19 0824  Weight: 138 lb 12.8 oz (63 kg)   Body mass index is 21.74 kg/m.  BP Readings from Last 3 Encounters:  06/23/19 112/68  11/29/18 137/69  11/17/18 120/74    Wt Readings from Last 3 Encounters:  06/23/19 138 lb 12.8 oz (63 kg)  11/29/18 140 lb (63.5 kg)  11/17/18 143 lb (64.9 kg)     Physical Exam Constitutional: She appears well-developed and well-nourished. No distress.  HENT:  Head: Normocephalic and atraumatic.  Right Ear: External ear normal. Normal ear canal and TM Left Ear: External ear normal.  Normal ear canal and TM Mouth/Throat: Oropharynx is clear and moist.  Eyes: Conjunctivae and EOM are normal.  Neck: Neck supple. No tracheal deviation present. No thyromegaly present.   No carotid bruit  Cardiovascular: Normal rate, regular rhythm and normal heart sounds.   No murmur heard.  No edema. Pulmonary/Chest: Effort normal and breath sounds normal. No respiratory distress. She has no wheezes. She has no rales.  Breast: deferred   Abdominal: Soft. She exhibits no distension. There is no tenderness.  Lymphadenopathy: She has no cervical adenopathy.  Skin: Skin is warm and dry. She is not diaphoretic.  Psychiatric: She has a normal mood and affect.  Her behavior is normal.        Assessment & Plan:   Physical exam: Screening blood work    ordered Immunizations  Flu today, others up to date Colonoscopy up-to-date Mammogram up-to-date Gyn up-to-date-Dr. Phineas Real Dexa up-to-date Eye exams up-to-date Exercise  Walks regularly Weight  BMI normal Substance abuse  none Sees derm annually   See Problem List for Assessment and Plan of chronic medical problems.   FU in one year

## 2019-06-22 NOTE — Assessment & Plan Note (Addendum)
DEXA up-to-date Getting Prolia via GYN every 6 months Walking regularly Taking vitamin D and calcium daily

## 2019-06-23 ENCOUNTER — Ambulatory Visit (INDEPENDENT_AMBULATORY_CARE_PROVIDER_SITE_OTHER): Payer: Medicare Other | Admitting: Internal Medicine

## 2019-06-23 ENCOUNTER — Other Ambulatory Visit (INDEPENDENT_AMBULATORY_CARE_PROVIDER_SITE_OTHER): Payer: Medicare Other

## 2019-06-23 ENCOUNTER — Encounter: Payer: Self-pay | Admitting: Internal Medicine

## 2019-06-23 ENCOUNTER — Other Ambulatory Visit: Payer: Self-pay

## 2019-06-23 VITALS — BP 112/68 | HR 65 | Temp 98.0°F | Resp 16 | Ht 67.0 in | Wt 138.8 lb

## 2019-06-23 DIAGNOSIS — R768 Other specified abnormal immunological findings in serum: Secondary | ICD-10-CM | POA: Diagnosis not present

## 2019-06-23 DIAGNOSIS — Z0000 Encounter for general adult medical examination without abnormal findings: Secondary | ICD-10-CM

## 2019-06-23 DIAGNOSIS — R350 Frequency of micturition: Secondary | ICD-10-CM

## 2019-06-23 DIAGNOSIS — Z23 Encounter for immunization: Secondary | ICD-10-CM | POA: Diagnosis not present

## 2019-06-23 DIAGNOSIS — M810 Age-related osteoporosis without current pathological fracture: Secondary | ICD-10-CM

## 2019-06-23 DIAGNOSIS — Z85828 Personal history of other malignant neoplasm of skin: Secondary | ICD-10-CM | POA: Insufficient documentation

## 2019-06-23 DIAGNOSIS — R911 Solitary pulmonary nodule: Secondary | ICD-10-CM

## 2019-06-23 LAB — URINALYSIS, ROUTINE W REFLEX MICROSCOPIC
Bilirubin Urine: NEGATIVE
Hgb urine dipstick: NEGATIVE
Ketones, ur: NEGATIVE
Leukocytes,Ua: NEGATIVE
Nitrite: NEGATIVE
RBC / HPF: NONE SEEN (ref 0–?)
Specific Gravity, Urine: 1.015 (ref 1.000–1.030)
Total Protein, Urine: NEGATIVE
Urine Glucose: NEGATIVE
Urobilinogen, UA: 0.2 (ref 0.0–1.0)
pH: 7 (ref 5.0–8.0)

## 2019-06-23 LAB — CBC WITH DIFFERENTIAL/PLATELET
Basophils Absolute: 0 10*3/uL (ref 0.0–0.1)
Basophils Relative: 0.8 % (ref 0.0–3.0)
Eosinophils Absolute: 0.2 10*3/uL (ref 0.0–0.7)
Eosinophils Relative: 3.4 % (ref 0.0–5.0)
HCT: 39.9 % (ref 36.0–46.0)
Hemoglobin: 13.5 g/dL (ref 12.0–15.0)
Lymphocytes Relative: 30.5 % (ref 12.0–46.0)
Lymphs Abs: 1.6 10*3/uL (ref 0.7–4.0)
MCHC: 33.8 g/dL (ref 30.0–36.0)
MCV: 97.4 fl (ref 78.0–100.0)
Monocytes Absolute: 0.5 10*3/uL (ref 0.1–1.0)
Monocytes Relative: 8.6 % (ref 3.0–12.0)
Neutro Abs: 3 10*3/uL (ref 1.4–7.7)
Neutrophils Relative %: 56.7 % (ref 43.0–77.0)
Platelets: 278 10*3/uL (ref 150.0–400.0)
RBC: 4.09 Mil/uL (ref 3.87–5.11)
RDW: 13.8 % (ref 11.5–15.5)
WBC: 5.3 10*3/uL (ref 4.0–10.5)

## 2019-06-23 LAB — LIPID PANEL
Cholesterol: 210 mg/dL — ABNORMAL HIGH (ref 0–200)
HDL: 86.7 mg/dL (ref >39.00)
LDL Cholesterol: 114 mg/dL — ABNORMAL HIGH (ref 0–99)
NonHDL: 123.26
Total CHOL/HDL Ratio: 2
Triglycerides: 47 mg/dL (ref 0.0–149.0)
VLDL: 9.4 mg/dL (ref 0.0–40.0)

## 2019-06-23 LAB — COMPREHENSIVE METABOLIC PANEL
ALT: 15 U/L (ref 0–35)
AST: 24 U/L (ref 0–37)
Albumin: 4.3 g/dL (ref 3.5–5.2)
Alkaline Phosphatase: 48 U/L (ref 39–117)
BUN: 16 mg/dL (ref 6–23)
CO2: 27 mEq/L (ref 19–32)
Calcium: 9.2 mg/dL (ref 8.4–10.5)
Chloride: 104 mEq/L (ref 96–112)
Creatinine, Ser: 0.78 mg/dL (ref 0.40–1.20)
GFR: 73.08 mL/min (ref >60.00)
Glucose, Bld: 91 mg/dL (ref 70–99)
Potassium: 4.1 mEq/L (ref 3.5–5.1)
Sodium: 140 mEq/L (ref 135–145)
Total Bilirubin: 0.7 mg/dL (ref 0.2–1.2)
Total Protein: 7.2 g/dL (ref 6.0–8.3)

## 2019-06-23 LAB — TSH: TSH: 3.5 u[IU]/mL (ref 0.35–4.50)

## 2019-06-23 NOTE — Addendum Note (Signed)
Addended by: Delice Bison E on: 06/23/2019 10:38 AM   Modules accepted: Orders

## 2019-06-23 NOTE — Assessment & Plan Note (Signed)
tsh

## 2019-06-23 NOTE — Assessment & Plan Note (Signed)
Sees Dr Delman Cheadle annually

## 2019-06-24 ENCOUNTER — Ambulatory Visit: Payer: Medicare Other

## 2019-06-25 ENCOUNTER — Encounter: Payer: Self-pay | Admitting: Internal Medicine

## 2019-06-25 LAB — URINE CULTURE
MICRO NUMBER:: 988929
SPECIMEN QUALITY:: ADEQUATE

## 2019-07-12 ENCOUNTER — Other Ambulatory Visit: Payer: Self-pay | Admitting: Gynecology

## 2019-07-12 ENCOUNTER — Ambulatory Visit (INDEPENDENT_AMBULATORY_CARE_PROVIDER_SITE_OTHER): Payer: Medicare Other | Admitting: Gynecology

## 2019-07-12 ENCOUNTER — Other Ambulatory Visit: Payer: Self-pay

## 2019-07-12 DIAGNOSIS — M810 Age-related osteoporosis without current pathological fracture: Secondary | ICD-10-CM | POA: Diagnosis not present

## 2019-07-12 MED ORDER — CLOBETASOL PROPIONATE 0.05 % EX CREA: TOPICAL_CREAM | CUTANEOUS | 0 refills | Status: DC

## 2019-07-12 MED ORDER — DENOSUMAB 60 MG/ML ~~LOC~~ SOSY
60.0000 mg | PREFILLED_SYRINGE | Freq: Once | SUBCUTANEOUS | Status: AC
  Administered 2019-07-12: 60 mg via SUBCUTANEOUS

## 2019-07-15 ENCOUNTER — Encounter: Payer: Self-pay | Admitting: Internal Medicine

## 2019-07-15 ENCOUNTER — Ambulatory Visit (INDEPENDENT_AMBULATORY_CARE_PROVIDER_SITE_OTHER)
Admission: RE | Admit: 2019-07-15 | Discharge: 2019-07-15 | Disposition: A | Discharge status: Home / Self Care | Payer: Medicare Other | Source: Ambulatory Visit | Attending: Internal Medicine | Admitting: Internal Medicine

## 2019-07-15 ENCOUNTER — Other Ambulatory Visit: Payer: Self-pay

## 2019-07-15 DIAGNOSIS — R911 Solitary pulmonary nodule: Secondary | ICD-10-CM | POA: Diagnosis not present

## 2019-07-15 DIAGNOSIS — R918 Other nonspecific abnormal finding of lung field: Secondary | ICD-10-CM | POA: Insufficient documentation

## 2019-08-26 ENCOUNTER — Encounter: Payer: Self-pay | Admitting: Gynecology

## 2019-09-03 ENCOUNTER — Encounter: Payer: Self-pay | Admitting: Internal Medicine

## 2019-09-27 NOTE — Telephone Encounter (Signed)
PROLIA GIVEN 07/12/2019 NEXT INJECTION 01/20/2020

## 2019-10-10 ENCOUNTER — Ambulatory Visit: Payer: Medicare Other

## 2019-10-17 ENCOUNTER — Ambulatory Visit: Payer: Medicare Other

## 2019-10-20 ENCOUNTER — Encounter: Payer: Self-pay | Admitting: Gynecology

## 2019-10-20 DIAGNOSIS — M85851 Other specified disorders of bone density and structure, right thigh: Secondary | ICD-10-CM | POA: Diagnosis not present

## 2019-10-20 DIAGNOSIS — M810 Age-related osteoporosis without current pathological fracture: Secondary | ICD-10-CM | POA: Diagnosis not present

## 2019-10-20 DIAGNOSIS — M85852 Other specified disorders of bone density and structure, left thigh: Secondary | ICD-10-CM | POA: Diagnosis not present

## 2019-10-20 DIAGNOSIS — Z1231 Encounter for screening mammogram for malignant neoplasm of breast: Secondary | ICD-10-CM | POA: Diagnosis not present

## 2019-10-21 ENCOUNTER — Ambulatory Visit: Payer: Medicare Other

## 2019-10-22 ENCOUNTER — Encounter: Payer: Self-pay | Admitting: Gynecology

## 2019-11-17 ENCOUNTER — Other Ambulatory Visit: Payer: Self-pay

## 2019-11-18 ENCOUNTER — Encounter: Payer: Self-pay | Admitting: Obstetrics and Gynecology

## 2019-11-18 ENCOUNTER — Ambulatory Visit: Payer: Medicare PPO | Admitting: Obstetrics and Gynecology

## 2019-11-18 VITALS — BP 114/72 | Ht 67.0 in | Wt 141.0 lb

## 2019-11-18 DIAGNOSIS — Z01419 Encounter for gynecological examination (general) (routine) without abnormal findings: Secondary | ICD-10-CM

## 2019-11-18 DIAGNOSIS — M810 Age-related osteoporosis without current pathological fracture: Secondary | ICD-10-CM | POA: Diagnosis not present

## 2019-11-18 NOTE — Progress Notes (Signed)
Catherine [name] 1951-01-13 [id number]  SUBJECTIVE:  70 y.o. G0P0 female for annual routine gynecologic exam. She has no gynecologic concerns.  Current Outpatient Medications  Medication Sig Dispense Refill  . calcium gluconate 500 MG tablet Take 500 mg by mouth daily.    . Cholecalciferol (VITAMIN D) 1000 UNITS capsule Take 1,000 Units by mouth daily.      . clobetasol cream (TEMOVATE) 0.05 % Apply to affected area daily as needed.  Avoid prolonged use 30 g 0  . denosumab (PROLIA) 60 MG/ML SOLN injection Inject 60 mg into the skin every 6 (six) months. Administer in upper arm, thigh, or abdomen    . glucosamine-chondroitin 500-400 MG tablet Take 1 tablet by mouth daily.      . Multiple Vitamin (MULTIVITAMIN) capsule Take 1 capsule by mouth daily.      . Omega-3 Fatty Acids (FISH OIL) 1000 MG CAPS Take 1,000 mg by mouth daily.     . Turmeric 500 MG CAPS Take 500 mg by mouth daily.     . fluocinonide (LIDEX) 0.05 % external solution Apply 1 application topically daily.   1   No current facility-administered medications for this visit.   Allergies: Oysters [shellfish allergy], Avocado, and Latex  Patient's last menstrual period was 09/09/1996.  Past medical history,surgical history, problem list, medications, allergies, family history and social history were all reviewed and documented as reviewed in the EPIC chart.  ROS:  Feeling well. No dyspnea or chest pain on exertion.  No abdominal pain, change in bowel habits, black or bloody stools.  No urinary tract symptoms. GYN ROS: no abnormal bleeding, pelvic pain or discharge, no breast pain or new or enlarging lumps on self exam. No neurological complaints.   OBJECTIVE:  BP 114/72   Ht 5\' 7"  (1.702 m)   Wt 141 lb (64 kg)   LMP 09/09/1996   BMI 22.08 kg/m  The patient appears well, alert, oriented x 3, in no distress. ENT normal.  Neck supple. No cervical or supraclavicular adenopathy or thyromegaly.  Lungs are clear, good air entry, no  wheezes, rhonchi or rales. S1 and S2 normal, no murmurs, regular rate and rhythm.  Abdomen soft without tenderness, guarding, mass or organomegaly.  Neurological is normal, no focal findings.  BREAST EXAM: breasts appear normal, no suspicious masses, no skin or nipple changes or axillary nodes  PELVIC EXAM: VULVA: normal appearing vulva with no masses, tenderness or lesions, atrophic changes noted, VAGINA: normal appearing vagina with normal color and discharge, no lesions, CERVIX: normal appearing cervix without discharge or lesions, UTERUS: uterus is normal size, shape, consistency and nontender, ADNEXA: normal adnexa in size, nontender and no masses  Chaperone: Caryn Bee present during the examination  ASSESSMENT:  69 y.o. G0P0 here for annual gynecologic exam  PLAN:   1. Postmenopausal.  No significant menopausal symptoms or vaginal bleeding. 2. Pap smear 10/2017.  No significant history of abnormal Pap smears.  Next Pap smear due 2022 following the current guidelines recommending the 3 year interval she does wish to continue screening.  Will readdress at that time. 3. Mammogram 10/2019.  Normal breast exam today.  Continue annual mammography. 4. Colonoscopy 2019.  Recommended that she follow up at the recommended interval.   5.  Osteoporosis.  DEXA 10/2019. T-score -2.5 at [facility].  Overall slight improvement.  Discussed options for management and decided to stay on Prolia for another year.  Her next dose will be due in May.  We will check calcium  and vitamin D levels in anticipation.  Next DEXA recommended 2023 so she plans to schedule this when due and will decide at that time she would like to continue treatment or not. 6. Health maintenance.  No labs today as she normally has these completed with her primary care provider. Return annually or sooner, prn.  Joseph Pierini MD  11/18/19

## 2019-11-19 LAB — CALCIUM: Calcium: 9.4 mg/dL (ref 8.6–10.4)

## 2019-11-19 LAB — VITAMIN D 25 HYDROXY (VIT D DEFICIENCY, FRACTURES): Vit D, 25-Hydroxy: 45 ng/mL (ref 30–100)

## 2019-11-19 NOTE — Progress Notes (Signed)
Labs look good okay to continue Prolia

## 2020-01-17 ENCOUNTER — Telehealth: Payer: Self-pay | Admitting: *Deleted

## 2020-01-17 NOTE — Telephone Encounter (Signed)
Prolia insurance verification has been sent awaiting Summary of benefits  

## 2020-01-24 ENCOUNTER — Ambulatory Visit: Payer: Medicare PPO

## 2020-01-24 ENCOUNTER — Other Ambulatory Visit: Payer: Self-pay

## 2020-01-24 ENCOUNTER — Ambulatory Visit (INDEPENDENT_AMBULATORY_CARE_PROVIDER_SITE_OTHER): Payer: Medicare PPO | Admitting: Gynecology

## 2020-01-24 DIAGNOSIS — M810 Age-related osteoporosis without current pathological fracture: Secondary | ICD-10-CM

## 2020-01-24 MED ORDER — DENOSUMAB 60 MG/ML ~~LOC~~ SOSY
60.0000 mg | PREFILLED_SYRINGE | Freq: Once | SUBCUTANEOUS | Status: AC
  Administered 2020-01-24: 60 mg via SUBCUTANEOUS

## 2020-01-24 NOTE — Telephone Encounter (Addendum)
Deductible N/A  OOP MAX N/A  Annual exam 11/18/2019 JK  Calcium    9.4         Date 11/18/2019  Upcoming dental procedures   Prior Authorization needed YES PROCESSING DID IT OVER PHONE  Pt estimated Cost $0   APPT 01/24/2020    Coverage Details:0% one dose, 0% admin fe

## 2020-03-29 ENCOUNTER — Encounter: Payer: Self-pay | Admitting: Gynecology

## 2020-04-28 NOTE — Telephone Encounter (Signed)
[place] GIVEN 5/17/22021 NEXT INJECTION 07/27/2020

## 2020-06-27 ENCOUNTER — Ambulatory Visit (INDEPENDENT_AMBULATORY_CARE_PROVIDER_SITE_OTHER): Payer: Medicare PPO

## 2020-06-27 DIAGNOSIS — Z0000 Encounter for general adult medical examination without abnormal findings: Secondary | ICD-10-CM

## 2020-06-27 DIAGNOSIS — I700 Atherosclerosis of aorta: Secondary | ICD-10-CM | POA: Insufficient documentation

## 2020-06-27 NOTE — Patient Instructions (Addendum)
Catherine [name] , Thank you for taking time to come for your Medicare Wellness Visit. I appreciate your ongoing commitment to your health goals. Please review the following plan we discussed and let me know if I can assist you in the future.   Screening recommendations/referrals: Colonoscopy: 11/03/2017; due every 10 years Mammogram: 10/20/2019 Bone Density: 10/22/2019; due every 2 years Recommended yearly ophthalmology/optometry visit for glaucoma screening and checkup Recommended yearly dental visit for hygiene and checkup  Vaccinations: Influenza vaccine: 05/29/2020 ([facility]) Pneumococcal vaccine: up to date Tdap vaccine: 01/29/2011; due every 10 years Shingles vaccine: up to date   Covid-19: up to date ([place])  Advanced directives: Please bring a copy of your health care power of attorney and living will to the office at your convenience.  Conditions/risks identified: Yes; Reviewed health maintenance screenings with patient today and relevant education, vaccines, and/or referrals were provided. Please continue to do your personal lifestyle choices by: daily care of teeth and gums, regular physical activity (goal should be 5 days a week for 30 minutes), eat a healthy diet, avoid tobacco and drug use, limiting any alcohol intake, taking a low-dose aspirin (if not allergic or have been advised by your provider otherwise) and taking vitamins and minerals as recommended by your provider. Continue doing brain stimulating activities (puzzles, reading, adult coloring books, staying active) to keep memory sharp. Continue to eat heart healthy diet (full of fruits, vegetables, whole grains, lean protein, water--limit salt, fat, and sugar intake) and increase physical activity as tolerated.  Next appointment: Please schedule your next Medicare Wellness Visit with your Nurse Health Advisor in 1 year by calling [phone].  Preventive Care 70 Years and Older, Female Preventive care refers to  lifestyle choices and visits with your health care provider that can promote health and wellness. What does preventive care include?  A yearly physical exam. This is also called an annual well check.  Dental exams once or twice a year.  Routine eye exams. Ask your health care provider how often you should have your eyes checked.  Personal lifestyle choices, including:  Daily care of your teeth and gums.  Regular physical activity.  Eating a healthy diet.  Avoiding tobacco and drug use.  Limiting alcohol use.  Practicing safe sex.  Taking low-dose aspirin every day.  Taking vitamin and mineral supplements as recommended by your health care provider. What happens during an annual well check? The services and screenings done by your health care provider during your annual well check will depend on your age, overall health, lifestyle risk factors, and family history of disease. Counseling  Your health care provider may ask you questions about your:  Alcohol use.  Tobacco use.  Drug use.  Emotional well-being.  Home and relationship well-being.  Sexual activity.  Eating habits.  History of falls.  Memory and ability to understand (cognition).  Work and work Statistician.  Reproductive health. Screening  You may have the following tests or measurements:  Height, weight, and BMI.  Blood pressure.  Lipid and cholesterol levels. These may be checked every 5 years, or more frequently if you are over 73 years old.  Skin check.  Lung cancer screening. You may have this screening every year starting at age 65 if you have a 30-pack-year history of smoking and currently smoke or have quit within the past 15 years.  Fecal occult blood test (FOBT) of the stool. You may have this test every year starting at age 58.  Flexible sigmoidoscopy or  colonoscopy. You may have a sigmoidoscopy every 5 years or a colonoscopy every 10 years starting at age 48.  Hepatitis C blood  test.  Hepatitis B blood test.  Sexually transmitted disease (STD) testing.  Diabetes screening. This is done by checking your blood sugar (glucose) after you have not eaten for a while (fasting). You may have this done every 1-3 years.  Bone density scan. This is done to screen for osteoporosis. You may have this done starting at age 39.  Mammogram. This may be done every 1-2 years. Talk to your health care provider about how often you should have regular mammograms. Talk with your health care provider about your test results, treatment options, and if necessary, the need for more tests. Vaccines  Your health care provider may recommend certain vaccines, such as:  Influenza vaccine. This is recommended every year.  Tetanus, diphtheria, and acellular pertussis (Tdap, Td) vaccine. You may need a Td booster every 10 years.  Zoster vaccine. You may need this after age 80.  Pneumococcal 13-valent conjugate (PCV13) vaccine. One dose is recommended after age 50.  Pneumococcal polysaccharide (PPSV23) vaccine. One dose is recommended after age 18. Talk to your health care provider about which screenings and vaccines you need and how often you need them. This information is not intended to replace advice given to you by your health care provider. Make sure you discuss any questions you have with your health care provider. Document Released: 09/22/2015 Document Revised: 05/15/2016 Document Reviewed: 06/27/2015 Elsevier Interactive Patient Education  2017 [place] Prevention in the Home Falls can cause injuries. They can happen to people of all ages. There are many things you can do to make your home safe and to help prevent falls. What can I do on the outside of my home?  Regularly fix the edges of walkways and driveways and fix any cracks.  Remove anything that might make you trip as you walk through a door, such as a raised step or threshold.  Trim any bushes or trees on the  path to your home.  Use bright outdoor lighting.  Clear any walking paths of anything that might make someone trip, such as rocks or tools.  Regularly check to see if handrails are loose or broken. Make sure that both sides of any steps have handrails.  Any raised decks and porches should have guardrails on the edges.  Have any leaves, snow, or ice cleared regularly.  Use sand or salt on walking paths during winter.  Clean up any spills in your garage right away. This includes oil or grease spills. What can I do in the bathroom?  Use night lights.  Install grab bars by the toilet and in the tub and shower. Do not use towel bars as grab bars.  Use non-skid mats or decals in the tub or shower.  If you need to sit down in the shower, use a plastic, non-slip stool.  Keep the floor dry. Clean up any water that spills on the floor as soon as it happens.  Remove soap buildup in the tub or shower regularly.  Attach bath mats securely with double-sided non-slip rug tape.  Do not have throw rugs and other things on the floor that can make you trip. What can I do in the bedroom?  Use night lights.  Make sure that you have a light by your bed that is easy to reach.  Do not use any sheets or blankets that are too  big for your bed. They should not hang down onto the floor.  Have a firm chair that has side arms. You can use this for support while you get dressed.  Do not have throw rugs and other things on the floor that can make you trip. What can I do in the kitchen?  Clean up any spills right away.  Avoid walking on wet floors.  Keep items that you use a lot in easy-to-reach places.  If you need to reach something above you, use a strong step stool that has a grab bar.  Keep electrical cords out of the way.  Do not use floor polish or wax that makes floors slippery. If you must use wax, use non-skid floor wax.  Do not have throw rugs and other things on the floor that can  make you trip. What can I do with my stairs?  Do not leave any items on the stairs.  Make sure that there are handrails on both sides of the stairs and use them. Fix handrails that are broken or loose. Make sure that handrails are as long as the stairways.  Check any carpeting to make sure that it is firmly attached to the stairs. Fix any carpet that is loose or worn.  Avoid having throw rugs at the top or bottom of the stairs. If you do have throw rugs, attach them to the floor with carpet tape.  Make sure that you have a light switch at the top of the stairs and the bottom of the stairs. If you do not have them, ask someone to add them for you. What else can I do to help prevent falls?  Wear shoes that:  Do not have high heels.  Have rubber bottoms.  Are comfortable and fit you well.  Are closed at the toe. Do not wear sandals.  If you use a stepladder:  Make sure that it is fully opened. Do not climb a closed stepladder.  Make sure that both sides of the stepladder are locked into place.  Ask someone to hold it for you, if possible.  Clearly mark and make sure that you can see:  Any grab bars or handrails.  First and last steps.  Where the edge of each step is.  Use tools that help you move around (mobility aids) if they are needed. These include:  Canes.  Walkers.  Scooters.  Crutches.  Turn on the lights when you go into a dark area. Replace any light bulbs as soon as they burn out.  Set up your furniture so you have a clear path. Avoid moving your furniture around.  If any of your floors are uneven, fix them.  If there are any pets around you, be aware of where they are.  Review your medicines with your doctor. Some medicines can make you feel dizzy. This can increase your chance of falling. Ask your doctor what other things that you can do to help prevent falls. This information is not intended to replace advice given to you by your health care  provider. Make sure you discuss any questions you have with your health care provider. Document Released: 06/22/2009 Document Revised: 02/01/2016 Document Reviewed: 09/30/2014 Elsevier Interactive Patient Education  2017 [employer].

## 2020-06-27 NOTE — Progress Notes (Signed)
I connected with Catherine [name] today by telephone and verified that I am speaking with the correct person using two identifiers. Location patient: home Location provider: work Persons participating in the virtual visit: Catherine [name] and Lisette Abu, LPN.   I discussed the limitations, risks, security and privacy concerns of performing an evaluation and management service by telephone and the availability of in person appointments. I also discussed with the patient that there may be a patient responsible charge related to this service. The patient expressed understanding and verbally consented to this telephonic visit.    Interactive audio and video telecommunications were attempted between this provider and patient, however failed, due to patient having technical difficulties OR patient did not have access to video capability.  We continued and completed visit with audio only.  Some vital signs may be absent or patient reported.   Time Spent with patient on telephone encounter: 20 minutes  Subjective:   Catherine [name] is a 69 y.o. female who presents for Medicare Annual (Subsequent) preventive examination.  Review of Systems    NO ROS. Medicare Wellness Visit. Cardiac Risk Factors include: advanced age (>79men, >84 women)     Objective:    There were no vitals filed for this visit. There is no height or weight on file to calculate BMI.  Advanced Directives 06/27/2020 11/29/2018 11/03/2017 10/30/2017 06/17/2016 06/11/2016 05/31/2016  Does Patient Have a Medical Advance Directive? Yes Yes Yes Yes Yes Yes Yes  Type of Paramedic of [place];Living will [place];Living will [place];Living will [place];Living will [place];Living will [place];Living will [place];Living will  Does patient want to make changes to medical advance directive? No -  Patient declined - - - - No - Patient declined -  Copy of [place] in Chart? No - copy requested No - copy requested No - copy requested No - copy requested - No - copy requested No - copy requested  Would patient like information on creating a medical advance directive? - No - Patient declined - - - - -    Current Medications (verified) Outpatient Encounter Medications as of 06/27/2020  Medication Sig  . calcium gluconate 500 MG tablet Take 500 mg by mouth daily.  . Cholecalciferol (VITAMIN D) 1000 UNITS capsule Take 1,000 Units by mouth daily.    . clobetasol cream (TEMOVATE) 0.05 % Apply to affected area daily as needed.  Avoid prolonged use  . denosumab (PROLIA) 60 MG/ML SOLN injection Inject 60 mg into the skin every 6 (six) months. Administer in upper arm, thigh, or abdomen  . glucosamine-chondroitin 500-400 MG tablet Take 1 tablet by mouth daily.    . Multiple Vitamin (MULTIVITAMIN) capsule Take 1 capsule by mouth daily.    . Omega-3 Fatty Acids (FISH OIL) 1000 MG CAPS Take 1,000 mg by mouth daily.   . Turmeric 500 MG CAPS Take 500 mg by mouth daily.   . [DISCONTINUED] fluocinonide (LIDEX) 0.05 % external solution Apply 1 application topically daily.    No facility-administered encounter medications on file as of 06/27/2020.    Allergies (verified) Oysters [shellfish allergy], Avocado, and Latex   History: Past Medical History:  Diagnosis Date  . Colon polyp 2017  . Osteoporosis 10/2017   T score -2.9   Past Surgical History:  Procedure Laterality Date  . COLONOSCOPY    . COLONOSCOPY WITH PROPOFOL N/A 06/17/2016   Procedure: COLONOSCOPY  WITH PROPOFOL;  Surgeon: Gatha Mayer, MD;  Location: [facility];  Service: Endoscopy;  Laterality: N/A;  . COLONOSCOPY WITH PROPOFOL N/A 11/03/2017   Procedure: COLONOSCOPY WITH PROPOFOL;  Surgeon: Gatha Mayer, MD;  Location: [facility];  Service: Endoscopy;  Laterality: N/A;  . HOT HEMOSTASIS N/A 06/17/2016    Procedure: HOT HEMOSTASIS (ARGON PLASMA COAGULATION/BICAP);  Surgeon: Gatha Mayer, MD;  Location: [facility];  Service: Endoscopy;  Laterality: N/A;  . HOT HEMOSTASIS N/A 11/03/2017   Procedure: HOT HEMOSTASIS (ARGON PLASMA COAGULATION/BICAP);  Surgeon: Gatha Mayer, MD;  Location: [facility];  Service: Endoscopy;  Laterality: N/A;  . POLYPECTOMY     Family History  Problem Relation Age of Onset  . Hypothyroidism Mother   . Thyroid disease Mother   . Lung cancer Father        smoker  . Thyroid disease Sister   . Thyroid disease Sister   . Thyroid disease Sister   . Kidney Stones Sister   . Colon cancer Neg Hx   . Colon polyps Neg Hx   . Esophageal cancer Neg Hx   . Rectal cancer Neg Hx   . Stomach cancer Neg Hx    Social History   Socioeconomic History  . Marital status: Married    Spouse name: Not on file  . Number of children: Not on file  . Years of education: Not on file  . Highest education level: Not on file  Occupational History  . Occupation: retired  Tobacco Use  . Smoking status: Never Smoker  . Smokeless tobacco: Never Used  Vaping Use  . Vaping Use: Never used  Substance and Sexual Activity  . Alcohol use: Yes    Alcohol/week: 0.0 standard drinks    Comment: wine with dinner  . Drug use: No  . Sexual activity: Yes    Birth control/protection: Post-menopausal    Comment: 1st intercourse 69 yo-Fewer than 5 partners  Other Topics Concern  . Not on file  Social History Narrative   Exercises regularly   Social Determinants of Health   Financial Resource Strain: Low Risk   . Difficulty of Paying Living Expenses: Not hard at all  Food Insecurity: No Food Insecurity  . Worried About Charity fundraiser in the Last Year: Never true  . Ran Out of Food in the Last Year: Never true  Transportation Needs: No Transportation Needs  . Lack of Transportation (Medical): No  . Lack of Transportation (Non-Medical): No  Physical Activity: Sufficiently Active   . Days of Exercise per Week: 6 days  . Minutes of Exercise per Session: 30 min  Stress: No Stress Concern Present  . Feeling of Stress : Not at all  Social Connections: Socially Integrated  . Frequency of Communication with Friends and Family: More than three times a week  . Frequency of Social Gatherings with Friends and Family: More than three times a week  . Attends Religious Services: More than 4 times per year  . Active Member of Clubs or Organizations: Yes  . Attends Archivist Meetings: More than 4 times per year  . Marital Status: Married    Tobacco Counseling Counseling given: Not Answered   Clinical Intake:  Pre-visit preparation completed: Yes  Pain : No/denies pain     Nutritional Risks: None Diabetes: No  How often do you need to have someone help you when you read instructions, pamphlets, or other written materials from your doctor or pharmacy?: 1 - Never  What is the last grade level you completed in school?: Doctorate Degree  Diabetic? no  Interpreter Needed?: No  Information entered by :: Lisette Abu, LPN   Activities of Daily Living In your present state of health, do you have any difficulty performing the following activities: 06/27/2020  Hearing? N  Vision? N  Difficulty concentrating or making decisions? N  Walking or climbing stairs? N  Dressing or bathing? N  Doing errands, shopping? N  Preparing Food and eating ? N  Using the Toilet? N  In the past six months, have you accidently leaked urine? N  Do you have problems with loss of bowel control? N  Managing your Medications? N  Managing your Finances? N  Housekeeping or managing your Housekeeping? N  Some recent data might be hidden    Patient Care Team: Binnie Rail, MD as PCP - General (Internal Medicine) Ralene Bathe, MD as Consulting Physician (Ophthalmology) Gatha Mayer, MD as Consulting Physician (Gastroenterology)  Indicate any recent Medical  Services you may have received from other than [facility] providers in the past year (date may be approximate).     Assessment:   This is a routine wellness examination for Catherine [name].  Hearing/Vision screen No exam data present  Dietary issues and exercise activities discussed: Current Exercise Habits: Home exercise routine;Structured exercise class, Type of exercise: walking (walking 2 miles 2-3 days a week, gym 3 days a week), Time (Minutes): 60, Frequency (Times/Week): 6, Weekly Exercise (Minutes/Week): 360, Intensity: Moderate, Exercise limited by: None identified  Goals    . patient     Continue to maintain current health status by exercising and eating healthy. Ensure that I get adequate sleep, enjoy life, family and friends.       Depression Screen PHQ 2/9 Scores 06/27/2020 06/23/2019 10/30/2017 04/14/2017 08/28/2016  PHQ - 2 Score 0 0 0 0 0  PHQ- 9 Score - - 0 - -    Fall Risk Fall Risk  06/27/2020 06/23/2019 10/30/2017 04/14/2017 08/28/2016  Falls in the past year? 0 0 No No No  Number falls in past yr: 0 0 - - -  Injury with Fall? 0 - - - -  Risk for fall due to : No Fall Risks - - - -  Follow up Falls evaluation completed - - - -    Any stairs in or around the home? Yes  If so, are there any without handrails? No  Home free of loose throw rugs in walkways, pet beds, electrical cords, etc? Yes  Adequate lighting in your home to reduce risk of falls? Yes   ASSISTIVE DEVICES UTILIZED TO PREVENT FALLS:  Life alert? No  Use of a cane, walker or w/c? No  Grab bars in the bathroom? No  Shower chair or bench in shower? No  Elevated toilet seat or a handicapped toilet? No   TIMED UP AND GO:  Was the test performed? No .  Length of time to ambulate 10 feet: 0 sec.   Gait steady and fast without use of assistive device  Cognitive Function: MMSE - Mini Mental State Exam 10/30/2017  Not completed: Refused        Immunizations Immunization History  Administered Date(s)  Administered  . Fluad Quad(high Dose 65+) 06/23/2019  . Hepatitis A 04/23/2006, 11/07/2006  . Hepatitis B 04/21/2013, 05/25/2013, 08/19/2013  . Influenza Split 07/03/2012, 06/11/2013  . Influenza Whole 06/30/2017  . Influenza, High Dose Seasonal PF 05/30/2016, 06/30/2018  . Influenza-Unspecified 06/16/2014, 06/19/2015, 05/29/2020  .  PFIZER SARS-COV-2 Vaccination 10/14/2019, 11/04/2019  . PPD Test 05/28/2016  . Pneumococcal Conjugate-13 08/16/2015  . Pneumococcal Polysaccharide-23 10/03/2016  . Td 05/29/2005  . Tdap 01/29/2011  . Zoster 11/21/2014  . Zoster Recombinat (Shingrix) 01/24/2017, 05/14/2017    TDAP status: Up to date Flu Vaccine status: Up to date Pneumococcal vaccine status: Up to date Covid-19 vaccine status: Completed vaccines  Qualifies for Shingles Vaccine? Yes   Zostavax completed Yes   Shingrix Completed?: Yes  Screening Tests Health Maintenance  Topic Date Due  . TETANUS/TDAP  01/28/2021  . MAMMOGRAM  10/19/2021  . DEXA SCAN  10/19/2021  . COLONOSCOPY  11/04/2027  . INFLUENZA VACCINE  Completed  . COVID-19 Vaccine  Completed  . Hepatitis C Screening  Completed  . PNA vac Low Risk Adult  Completed    Health Maintenance  There are no preventive care reminders to display for this patient.  Colorectal cancer screening: Completed 11/03/2017. Repeat every 10 years Mammogram status: Completed 10/20/2019. Repeat every year Bone Density status: Completed 10/22/2019. Results reflect: Bone density results: OSTEOPOROSIS. Repeat every 2 years.  Lung Cancer Screening: (Low Dose CT Chest recommended if Age 52-80 years, 30 pack-year currently smoking OR have quit w/in 15years.) does not qualify.   Lung Cancer Screening Referral: no  Additional Screening:  Hepatitis C Screening: does qualify; Completed yes  Vision Screening: Recommended annual ophthalmology exams for early detection of glaucoma and other disorders of the eye. Is the patient up to date with their  annual eye exam?  Yes  Who is the provider or what is the name of the office in which the patient attends annual eye exams? Jola Schmidt, MD If pt is not established with a provider, would they like to be referred to a provider to establish care? No .   Dental Screening: Recommended annual dental exams for proper oral hygiene  Community Resource Referral / Chronic Care Management: CRR required this visit?  No   CCM required this visit?  No      [name]:     I have personally reviewed and noted the following in the patient's chart:   . Medical and social history . Use of alcohol, tobacco or illicit drugs  . Current medications and supplements . Functional ability and status . Nutritional status . Physical activity . Advanced directives . List of other physicians . Hospitalizations, surgeries, and ER visits in previous 12 months . Vitals . Screenings to include cognitive, depression, and falls . Referrals and appointments  In addition, I have reviewed and discussed with patient certain preventive protocols, quality metrics, and best practice recommendations. A written personalized care [name] for preventive services as well as general preventive health recommendations were provided to patient.     Sheral Flow, LPN   [id number]   Nurse Notes:  Patient is cogitatively intact. There were no vitals filed for this visit. There is no height or weight on file to calculate BMI. Patient stated that she has no issues with gait or balance; does not use any assistive devices.

## 2020-06-27 NOTE — Patient Instructions (Addendum)
Blood work was ordered.    All other Health Maintenance issues reviewed.   All recommended immunizations and age-appropriate screenings are up-to-date or discussed.  No immunization administered today.   Medications reviewed and updated.  Changes include :   none   A ct scan was ordered to follow up your lung nodule.   Someone will call you to schedule an appointment.    Please followup in 1 year for a physical     Health Maintenance, Female Adopting a healthy lifestyle and getting preventive care are important in promoting health and wellness. Ask your health care provider about:  The right schedule for you to have regular tests and exams.  Things you can do on your own to prevent diseases and keep yourself healthy. What should I know about diet, weight, and exercise? Eat a healthy diet   Eat a diet that includes plenty of vegetables, fruits, low-fat dairy products, and lean protein.  Do not eat a lot of foods that are high in solid fats, added sugars, or sodium. Maintain a healthy weight Body mass index (BMI) is used to identify weight problems. It estimates body fat based on height and weight. Your health care provider can help determine your BMI and help you achieve or maintain a healthy weight. Get regular exercise Get regular exercise. This is one of the most important things you can do for your health. Most adults should:  Exercise for at least 150 minutes each week. The exercise should increase your heart rate and make you sweat (moderate-intensity exercise).  Do strengthening exercises at least twice a week. This is in addition to the moderate-intensity exercise.  Spend less time sitting. Even light physical activity can be beneficial. Watch cholesterol and blood lipids Have your blood tested for lipids and cholesterol at 70 years of age, then have this test every 5 years. Have your cholesterol levels checked more often if:  Your lipid or cholesterol levels are  high.  You are older than 70 years of age.  You are at high risk for heart disease. What should I know about cancer screening? Depending on your health history and family history, you may need to have cancer screening at various ages. This may include screening for:  Breast cancer.  Cervical cancer.  Colorectal cancer.  Skin cancer.  Lung cancer. What should I know about heart disease, diabetes, and high blood pressure? Blood pressure and heart disease  High blood pressure causes heart disease and increases the risk of stroke. This is more likely to develop in people who have high blood pressure readings, are of African descent, or are overweight.  Have your blood pressure checked: ? Every 3-5 years if you are 52-80 years of age. ? Every year if you are 37 years old or older. Diabetes Have regular diabetes screenings. This checks your fasting blood sugar level. Have the screening done:  Once every three years after age 10 if you are at a normal weight and have a low risk for diabetes.  More often and at a younger age if you are overweight or have a high risk for diabetes. What should I know about preventing infection? Hepatitis B If you have a higher risk for hepatitis B, you should be screened for this virus. Talk with your health care provider to find out if you are at risk for hepatitis B infection. Hepatitis C Testing is recommended for:  Everyone born from 1 through 1966.  Anyone with known risk factors for hepatitis  C. Sexually transmitted infections (STIs)  Get screened for STIs, including gonorrhea and chlamydia, if: ? You are sexually active and are younger than 70 years of age. ? You are older than 70 years of age and your health care provider tells you that you are at risk for this type of infection. ? Your sexual activity has changed since you were last screened, and you are at increased risk for chlamydia or gonorrhea. Ask your health care provider if you  are at risk.  Ask your health care provider about whether you are at high risk for HIV. Your health care provider may recommend a prescription medicine to help prevent HIV infection. If you choose to take medicine to prevent HIV, you should first get tested for HIV. You should then be tested every 3 months for as long as you are taking the medicine. Pregnancy  If you are about to stop having your period (premenopausal) and you may become pregnant, seek counseling before you get pregnant.  Take 400 to 800 micrograms (mcg) of folic acid every day if you become pregnant.  Ask for birth control (contraception) if you want to prevent pregnancy. Osteoporosis and menopause Osteoporosis is a disease in which the bones lose minerals and strength with aging. This can result in bone fractures. If you are 68 years old or older, or if you are at risk for osteoporosis and fractures, ask your health care provider if you should:  Be screened for bone loss.  Take a calcium or vitamin D supplement to lower your risk of fractures.  Be given hormone replacement therapy (HRT) to treat symptoms of menopause. Follow these instructions at home: Lifestyle  Do not use any products that contain nicotine or tobacco, such as cigarettes, e-cigarettes, and chewing tobacco. If you need help quitting, ask your health care provider.  Do not use street drugs.  Do not share needles.  Ask your health care provider for help if you need support or information about quitting drugs. Alcohol use  Do not drink alcohol if: ? Your health care provider tells you not to drink. ? You are pregnant, may be pregnant, or are planning to become pregnant.  If you drink alcohol: ? Limit how much you use to 0-1 drink a day. ? Limit intake if you are breastfeeding.  Be aware of how much alcohol is in your drink. In the [place] one drink equals one 12 oz bottle of beer (355 mL), one 5 oz glass of wine (148 mL), or one 1 oz glass of hard  liquor (44 mL). General instructions  Schedule regular health, dental, and eye exams.  Stay current with your vaccines.  Tell your health care provider if: ? You often feel depressed. ? You have ever been abused or do not feel safe at home. Summary  Adopting a healthy lifestyle and getting preventive care are important in promoting health and wellness.  Follow your health care provider's instructions about healthy diet, exercising, and getting tested or screened for diseases.  Follow your health care provider's instructions on monitoring your cholesterol and blood pressure. This information is not intended to replace advice given to you by your health care provider. Make sure you discuss any questions you have with your health care provider. Document Revised: 08/19/2018 Document Reviewed: 08/19/2018 Elsevier Patient Education  2020 [employer].

## 2020-06-27 NOTE — Progress Notes (Signed)
Subjective:    Patient ID: Catherine [name], female    DOB: [birth year], 69 y.o.   MRN: [id number]   This visit occurred during the SARS-CoV-2 public health emergency.  Safety protocols were in place, including screening questions prior to the visit, additional usage of staff PPE, and extensive cleaning of exam room while observing appropriate contact time as indicated for disinfecting solutions.    HPI She is here for a physical exam.   She has no concerns.  No change in health.     Medications and allergies reviewed with patient and updated if appropriate.  Patient Active Problem List   Diagnosis Date Noted  . Aortic atherosclerosis ([place]) 06/27/2020  . Lung nodules 07/15/2019  . History of basal cell carcinoma (BCC) 06/23/2019  . Thyroid antibody positive 11/10/2016  . Osteoporosis 08/16/2015  . History of colonic polyps 07/04/2012  . HYPERCHOLESTEROLEMIA 05/06/2008    Current Outpatient Medications on File Prior to Visit  Medication Sig Dispense Refill  . calcium gluconate 500 MG tablet Take 500 mg by mouth daily.    . Cholecalciferol (VITAMIN D) 1000 UNITS capsule Take 1,000 Units by mouth daily.      . clobetasol cream (TEMOVATE) 0.05 % Apply to affected area daily as needed.  Avoid prolonged use 30 g 0  . denosumab (PROLIA) 60 MG/ML SOLN injection Inject 60 mg into the skin every 6 (six) months. Administer in upper arm, thigh, or abdomen    . glucosamine-chondroitin 500-400 MG tablet Take 1 tablet by mouth daily.      . Multiple Vitamin (MULTIVITAMIN) capsule Take 1 capsule by mouth daily.      . Omega-3 Fatty Acids (FISH OIL) 1000 MG CAPS Take 1,000 mg by mouth daily.     . Turmeric 500 MG CAPS Take 500 mg by mouth daily.      No current facility-administered medications on file prior to visit.    Past Medical History:  Diagnosis Date  . Colon polyp 2017  . Osteoporosis 10/2017   T score -2.9    Past Surgical History:  Procedure Laterality Date  . COLONOSCOPY     . COLONOSCOPY WITH PROPOFOL N/A 06/17/2016   Procedure: COLONOSCOPY WITH PROPOFOL;  Surgeon: Gatha Mayer, MD;  Location: [facility];  Service: Endoscopy;  Laterality: N/A;  . COLONOSCOPY WITH PROPOFOL N/A 11/03/2017   Procedure: COLONOSCOPY WITH PROPOFOL;  Surgeon: Gatha Mayer, MD;  Location: [facility];  Service: Endoscopy;  Laterality: N/A;  . HOT HEMOSTASIS N/A 06/17/2016   Procedure: HOT HEMOSTASIS (ARGON PLASMA COAGULATION/BICAP);  Surgeon: Gatha Mayer, MD;  Location: [facility];  Service: Endoscopy;  Laterality: N/A;  . HOT HEMOSTASIS N/A 11/03/2017   Procedure: HOT HEMOSTASIS (ARGON PLASMA COAGULATION/BICAP);  Surgeon: Gatha Mayer, MD;  Location: [facility];  Service: Endoscopy;  Laterality: N/A;  . POLYPECTOMY      Social History   Socioeconomic History  . Marital status: Married    Spouse name: Not on file  . Number of children: Not on file  . Years of education: Not on file  . Highest education level: Not on file  Occupational History  . Occupation: retired  Tobacco Use  . Smoking status: Never Smoker  . Smokeless tobacco: Never Used  Vaping Use  . Vaping Use: Never used  Substance and Sexual Activity  . Alcohol use: Yes    Alcohol/week: 0.0 standard drinks    Comment: wine with dinner  . Drug use: No  . Sexual activity:  Yes    Birth control/protection: Post-menopausal    Comment: 1st intercourse 69 yo-Fewer than 5 partners  Other Topics Concern  . Not on file  Social History Narrative   Exercises regularly   Social Determinants of Health   Financial Resource Strain: Low Risk   . Difficulty of Paying Living Expenses: Not hard at all  Food Insecurity: No Food Insecurity  . Worried About Charity fundraiser in the Last Year: Never true  . Ran Out of Food in the Last Year: Never true  Transportation Needs: No Transportation Needs  . Lack of Transportation (Medical): No  . Lack of Transportation (Non-Medical): No  Physical Activity:  Sufficiently Active  . Days of Exercise per Week: 6 days  . Minutes of Exercise per Session: 30 min  Stress: No Stress Concern Present  . Feeling of Stress : Not at all  Social Connections: Socially Integrated  . Frequency of Communication with Friends and Family: More than three times a week  . Frequency of Social Gatherings with Friends and Family: More than three times a week  . Attends Religious Services: More than 4 times per year  . Active Member of Clubs or Organizations: Yes  . Attends Archivist Meetings: More than 4 times per year  . Marital Status: Married    Family History  Problem Relation Age of Onset  . Hypothyroidism Mother   . Thyroid disease Mother   . Lung cancer Father        smoker  . Thyroid disease Sister   . Thyroid disease Sister   . Thyroid disease Sister   . Kidney Stones Sister   . Colon cancer Neg Hx   . Colon polyps Neg Hx   . Esophageal cancer Neg Hx   . Rectal cancer Neg Hx   . Stomach cancer Neg Hx     Review of Systems  Constitutional: Negative for chills and fever.  Eyes: Negative for visual disturbance.  Respiratory: Negative for cough, shortness of breath and wheezing.   Cardiovascular: Negative for chest pain, palpitations and leg swelling.  Gastrointestinal: Negative for abdominal pain, blood in stool, constipation, diarrhea and nausea.       No gerd  Genitourinary: Negative for dysuria and hematuria.  Musculoskeletal: Negative for arthralgias and back pain.  Skin: Negative for rash.  Neurological: Negative for dizziness, light-headedness and headaches.  Psychiatric/Behavioral: Negative for dysphoric mood. The patient is not nervous/anxious.        Objective:   Vitals:   06/28/20 0829  BP: 110/78  Pulse: (!) 58  Temp: 98 F (36.7 C)  SpO2: 99%   Filed Weights   06/28/20 0829  Weight: 142 lb 6.4 oz (64.6 kg)   Body mass index is 22.3 kg/m.  BP Readings from Last 3 Encounters:  06/28/20 110/78  11/18/19  114/72  06/23/19 112/68    Wt Readings from Last 3 Encounters:  06/28/20 142 lb 6.4 oz (64.6 kg)  11/18/19 141 lb (64 kg)  06/23/19 138 lb 12.8 oz (63 kg)     Physical Exam Constitutional: She appears well-developed and well-nourished. No distress.  HENT:  Head: Normocephalic and atraumatic.  Right Ear: External ear normal. Normal ear canal and TM Left Ear: External ear normal.  Normal ear canal and TM Mouth/Throat: Oropharynx is clear and moist.  Eyes: Conjunctivae and EOM are normal.  Neck: Neck supple. No tracheal deviation present. No thyromegaly present.  No carotid bruit  Cardiovascular: Normal rate, regular rhythm and  normal heart sounds.   No murmur heard.  No edema. Pulmonary/Chest: Effort normal and breath sounds normal. No respiratory distress. She has no wheezes. She has no rales.  Breast: deferred   Abdominal: Soft. She exhibits no distension. There is no tenderness.  Lymphadenopathy: She has no cervical adenopathy.  Skin: Skin is warm and dry. She is not diaphoretic.  Psychiatric: She has a normal mood and affect. Her behavior is normal.   The 10-year ASCVD risk score Mikey Bussing DC Jr., et al., 2013) is: 6.6%   Values used to calculate the score:     Age: 58 years     Sex: Female     Is Non-Hispanic African American: No     Diabetic: No     Tobacco smoker: No     Systolic Blood Pressure: 625 mmHg     Is BP treated: No     HDL Cholesterol: 86.7 mg/dL     Total Cholesterol: 210 mg/dL      Assessment & Plan:   Physical exam: Screening blood work    ordered Immunizations  Up to date  Colonoscopy  Up to date  Mammogram  Up to date  Gyn  Up to date  Dexa  Up to date  Eye exams  Up to date  Exercise  Walks regularly Weight  Normal  - goes to gym Substance abuse  None Sees derm annually      See Problem List for Assessment and Plan of chronic medical problems.    One year

## 2020-06-28 ENCOUNTER — Encounter: Payer: Self-pay | Admitting: Internal Medicine

## 2020-06-28 ENCOUNTER — Ambulatory Visit (INDEPENDENT_AMBULATORY_CARE_PROVIDER_SITE_OTHER): Payer: Medicare PPO | Admitting: Internal Medicine

## 2020-06-28 ENCOUNTER — Other Ambulatory Visit: Payer: Self-pay

## 2020-06-28 VITALS — BP 110/78 | HR 58 | Temp 98.0°F | Ht 67.0 in | Wt 142.4 lb

## 2020-06-28 DIAGNOSIS — R918 Other nonspecific abnormal finding of lung field: Secondary | ICD-10-CM

## 2020-06-28 DIAGNOSIS — Z0000 Encounter for general adult medical examination without abnormal findings: Secondary | ICD-10-CM | POA: Diagnosis not present

## 2020-06-28 DIAGNOSIS — R768 Other specified abnormal immunological findings in serum: Secondary | ICD-10-CM | POA: Diagnosis not present

## 2020-06-28 DIAGNOSIS — I700 Atherosclerosis of aorta: Secondary | ICD-10-CM | POA: Diagnosis not present

## 2020-06-28 DIAGNOSIS — E7800 Pure hypercholesterolemia, unspecified: Secondary | ICD-10-CM

## 2020-06-28 DIAGNOSIS — M810 Age-related osteoporosis without current pathological fracture: Secondary | ICD-10-CM

## 2020-06-28 LAB — COMPREHENSIVE METABOLIC PANEL
ALT: 21 U/L (ref 0–35)
AST: 25 U/L (ref 0–37)
Albumin: 4.4 g/dL (ref 3.5–5.2)
Alkaline Phosphatase: 46 U/L (ref 39–117)
BUN: 21 mg/dL (ref 6–23)
CO2: 28 mEq/L (ref 19–32)
Calcium: 9.3 mg/dL (ref 8.4–10.5)
Chloride: 103 mEq/L (ref 96–112)
Creatinine, Ser: 0.81 mg/dL (ref 0.40–1.20)
GFR: 73.21 mL/min (ref >60.00)
Glucose, Bld: 87 mg/dL (ref 70–99)
Potassium: 4.6 mEq/L (ref 3.5–5.1)
Sodium: 138 mEq/L (ref 135–145)
Total Bilirubin: 0.8 mg/dL (ref 0.2–1.2)
Total Protein: 7.3 g/dL (ref 6.0–8.3)

## 2020-06-28 LAB — CBC WITH DIFFERENTIAL/PLATELET
Basophils Absolute: 0.1 10*3/uL (ref 0.0–0.1)
Basophils Relative: 1 % (ref 0.0–3.0)
Eosinophils Absolute: 0.2 10*3/uL (ref 0.0–0.7)
Eosinophils Relative: 2.9 % (ref 0.0–5.0)
HCT: 41.9 % (ref 36.0–46.0)
Hemoglobin: 14 g/dL (ref 12.0–15.0)
Lymphocytes Relative: 30.2 % (ref 12.0–46.0)
Lymphs Abs: 1.7 10*3/uL (ref 0.7–4.0)
MCHC: 33.4 g/dL (ref 30.0–36.0)
MCV: 97.9 fl (ref 78.0–100.0)
Monocytes Absolute: 0.6 10*3/uL (ref 0.1–1.0)
Monocytes Relative: 10.5 % (ref 3.0–12.0)
Neutro Abs: 3.1 10*3/uL (ref 1.4–7.7)
Neutrophils Relative %: 55.4 % (ref 43.0–77.0)
Platelets: 296 10*3/uL (ref 150.0–400.0)
RBC: 4.28 Mil/uL (ref 3.87–5.11)
RDW: 13.9 % (ref 11.5–15.5)
WBC: 5.6 10*3/uL (ref 4.0–10.5)

## 2020-06-28 LAB — LIPID PANEL
Cholesterol: 238 mg/dL — ABNORMAL HIGH (ref 0–200)
HDL: 88.2 mg/dL (ref >39.00)
LDL Cholesterol: 138 mg/dL — ABNORMAL HIGH (ref 0–99)
NonHDL: 149.77
Total CHOL/HDL Ratio: 3
Triglycerides: 61 mg/dL (ref 0.0–149.0)
VLDL: 12.2 mg/dL (ref 0.0–40.0)

## 2020-06-28 LAB — TSH: TSH: 2.96 u[IU]/mL (ref 0.35–4.50)

## 2020-06-28 LAB — VITAMIN D 25 HYDROXY (VIT D DEFICIENCY, FRACTURES): VITD: 63.31 ng/mL (ref 30.00–100.00)

## 2020-06-28 NOTE — Assessment & Plan Note (Signed)
Chronic Ct of chest ordered for lung nodule follow up

## 2020-06-28 NOTE — Assessment & Plan Note (Signed)
Chronic  Clinically euthyroid Check tsh    

## 2020-06-28 NOTE — Assessment & Plan Note (Signed)
Chronic Check lipid panel  Lifestyle controlled Regular exercise and healthy diet

## 2020-06-28 NOTE — Assessment & Plan Note (Addendum)
Chronic Doing prolia through gyn Taking calcium and vitamin d Exercises regularly Check vitamin d level

## 2020-06-28 NOTE — Assessment & Plan Note (Addendum)
Chronic Minimal on CT Discussed diagnosis and implications Cholesterol fairly good, never smoker, healthy diet and regular exercise.  Weight is good Discussed considering low dose statin for prevention and possible side effects Overall very low risk  Check lipids today Will work a little more on diet and continue regular exercise Will continue to discuss statins at each visit but we both agree focusing on lifestyle is likely best for her

## 2020-07-06 DIAGNOSIS — Z86018 Personal history of other benign neoplasm: Secondary | ICD-10-CM | POA: Diagnosis not present

## 2020-07-06 DIAGNOSIS — L578 Other skin changes due to chronic exposure to nonionizing radiation: Secondary | ICD-10-CM | POA: Diagnosis not present

## 2020-07-06 DIAGNOSIS — D225 Melanocytic nevi of trunk: Secondary | ICD-10-CM | POA: Diagnosis not present

## 2020-07-06 DIAGNOSIS — L821 Other seborrheic keratosis: Secondary | ICD-10-CM | POA: Diagnosis not present

## 2020-07-06 DIAGNOSIS — D2261 Melanocytic nevi of right upper limb, including shoulder: Secondary | ICD-10-CM | POA: Diagnosis not present

## 2020-07-06 DIAGNOSIS — D2271 Melanocytic nevi of right lower limb, including hip: Secondary | ICD-10-CM | POA: Diagnosis not present

## 2020-07-06 DIAGNOSIS — Z85828 Personal history of other malignant neoplasm of skin: Secondary | ICD-10-CM | POA: Diagnosis not present

## 2020-07-06 DIAGNOSIS — N6459 Other signs and symptoms in breast: Secondary | ICD-10-CM | POA: Diagnosis not present

## 2020-07-11 ENCOUNTER — Inpatient Hospital Stay: Admission: RE | Admit: 2020-07-11 | Payer: Medicare PPO | Source: Ambulatory Visit

## 2020-07-12 ENCOUNTER — Ambulatory Visit (INDEPENDENT_AMBULATORY_CARE_PROVIDER_SITE_OTHER)
Admission: RE | Admit: 2020-07-12 | Discharge: 2020-07-12 | Disposition: A | Discharge status: Home / Self Care | Payer: Medicare PPO | Source: Ambulatory Visit | Attending: Internal Medicine | Admitting: Internal Medicine

## 2020-07-12 ENCOUNTER — Other Ambulatory Visit: Payer: Self-pay

## 2020-07-12 ENCOUNTER — Telehealth: Payer: Self-pay | Admitting: *Deleted

## 2020-07-12 DIAGNOSIS — R911 Solitary pulmonary nodule: Secondary | ICD-10-CM | POA: Diagnosis not present

## 2020-07-12 DIAGNOSIS — I700 Atherosclerosis of aorta: Secondary | ICD-10-CM | POA: Diagnosis not present

## 2020-07-12 DIAGNOSIS — R918 Other nonspecific abnormal finding of lung field: Secondary | ICD-10-CM

## 2020-07-12 DIAGNOSIS — J984 Other disorders of lung: Secondary | ICD-10-CM | POA: Diagnosis not present

## 2020-07-12 NOTE — Telephone Encounter (Signed)
Deductible NO  OOP MAX NO  Annual exam 11/18/2019  Calcium  9.3           Date 06/28/2020  Upcoming dental procedures NO  Prior Authorization needed YES ON FILE THROUGH 09/08/2021  Pt estimated  $0    APPT 07/27/2020   Coverage Details: 0% ONE DOSE, 0% ADMIN FEE

## 2020-07-27 ENCOUNTER — Other Ambulatory Visit: Payer: Self-pay

## 2020-07-27 ENCOUNTER — Ambulatory Visit (INDEPENDENT_AMBULATORY_CARE_PROVIDER_SITE_OTHER): Payer: Medicare PPO | Admitting: Gynecology

## 2020-07-27 DIAGNOSIS — M810 Age-related osteoporosis without current pathological fracture: Secondary | ICD-10-CM | POA: Diagnosis not present

## 2020-07-27 MED ORDER — DENOSUMAB 60 MG/ML ~~LOC~~ SOSY
60.0000 mg | PREFILLED_SYRINGE | Freq: Once | SUBCUTANEOUS | Status: AC
  Administered 2020-07-27: 60 mg via SUBCUTANEOUS

## 2020-10-16 DIAGNOSIS — Z20822 Contact with and (suspected) exposure to covid-19: Secondary | ICD-10-CM | POA: Diagnosis not present

## 2020-10-16 DIAGNOSIS — Z1152 Encounter for screening for COVID-19: Secondary | ICD-10-CM | POA: Diagnosis not present

## 2020-10-25 DIAGNOSIS — Z1231 Encounter for screening mammogram for malignant neoplasm of breast: Secondary | ICD-10-CM | POA: Diagnosis not present

## 2020-11-07 DIAGNOSIS — H43811 Vitreous degeneration, right eye: Secondary | ICD-10-CM | POA: Diagnosis not present

## 2020-11-07 DIAGNOSIS — H2513 Age-related nuclear cataract, bilateral: Secondary | ICD-10-CM | POA: Diagnosis not present

## 2020-11-07 DIAGNOSIS — H5213 Myopia, bilateral: Secondary | ICD-10-CM | POA: Diagnosis not present

## 2020-12-06 NOTE — Telephone Encounter (Signed)
Prolia given 07/27/2020 Next injection 01/25/2021

## 2020-12-18 IMAGING — CT CT CHEST WITH CONTRAST
2 of 4 series · 15 of 36 positions shown, 18 images · IV contrast (omnipaque)
Comparison: Chest x-ray today.

CLINICAL DATA: Evaluate apical density seen on recent chest x-ray.
Right chest and shoulder pain.

EXAM:
CT CHEST WITH CONTRAST
TECHNIQUE: Multidetector CT imaging of the chest was performed during
intravenous contrast administration.
CONTRAST:  75mL OMNIPAQUE IOHEXOL 300 MG/ML  SOLN

[Series 2: axial st · axial · 0.62mm/px · z∈[-309,-35]mm · 12 of 161 slices shown, 15 images]
[im 12/161  mediastinal]
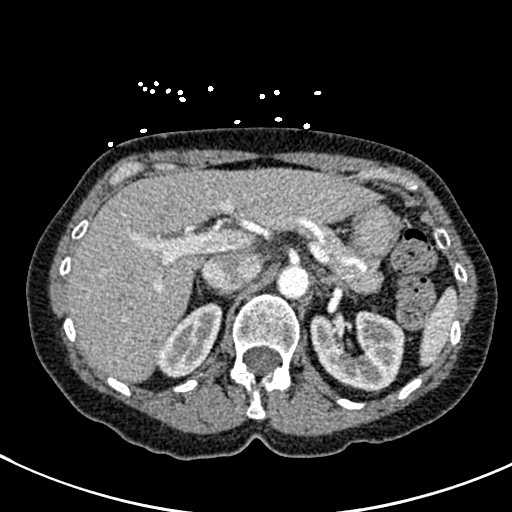
[im 12/161  lung]
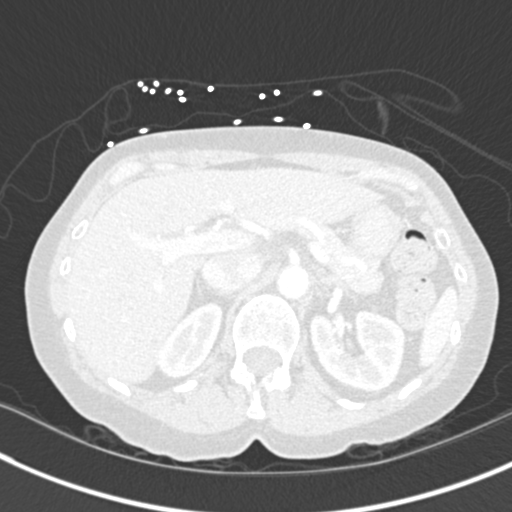
[im 23/161  lung]
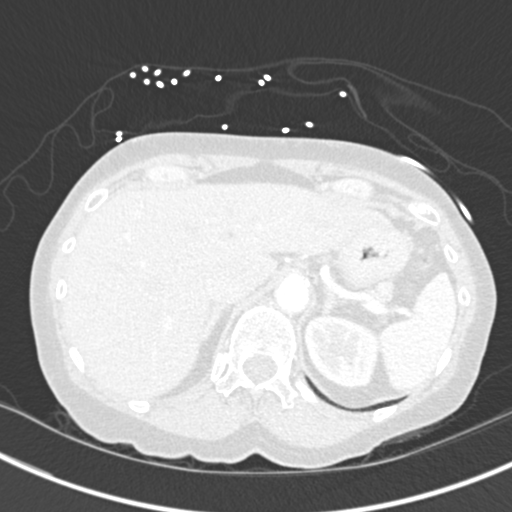
[im 35/161  lung]
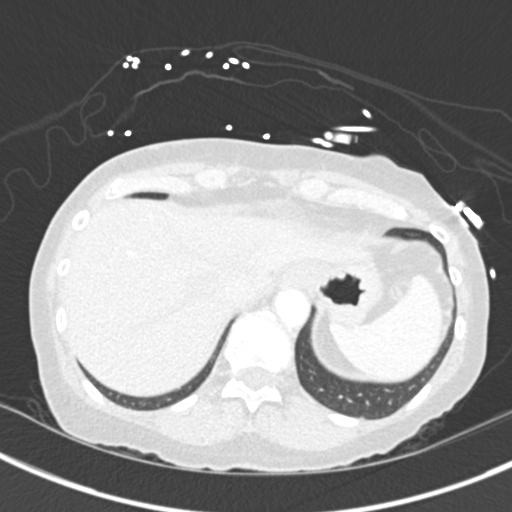
[im 46/161  lung]
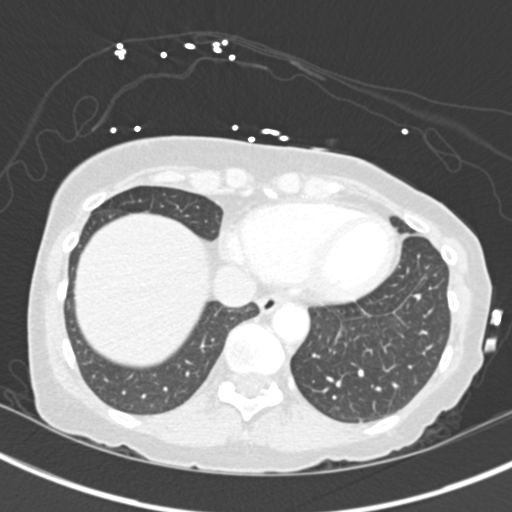
[im 58/161  mediastinal]
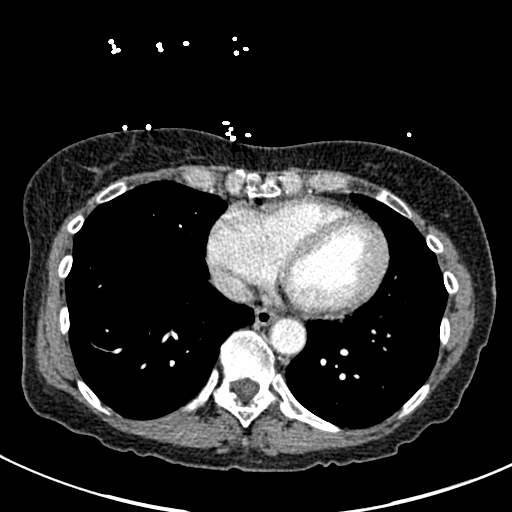
[im 58/161  lung]
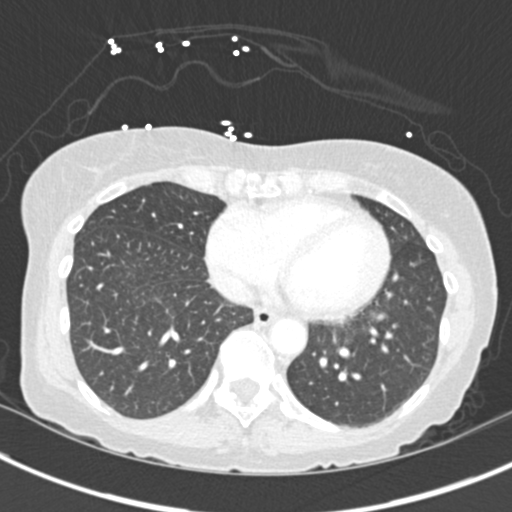
[im 69/161  lung]
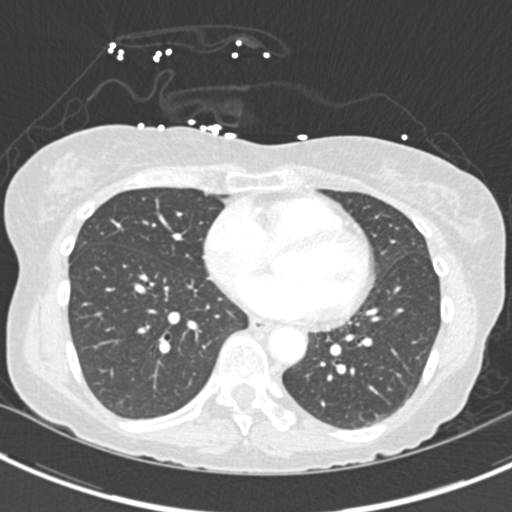
[im 92/161  lung]
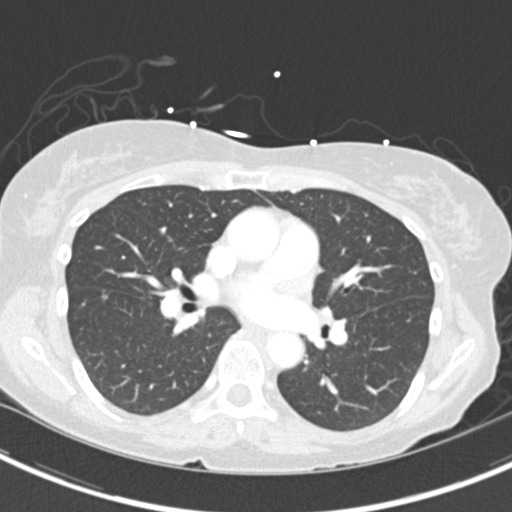
[im 103/161  lung]
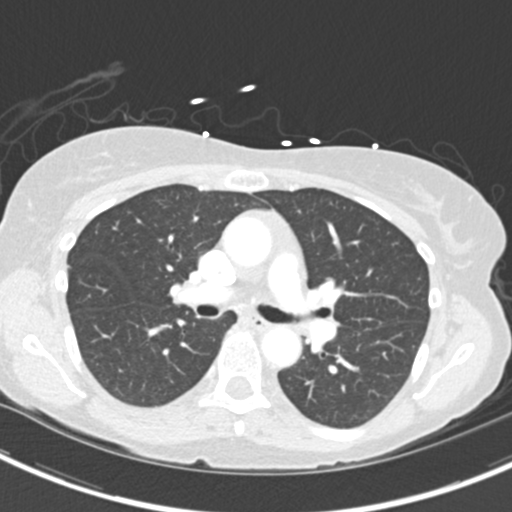
[im 115/161  mediastinal]
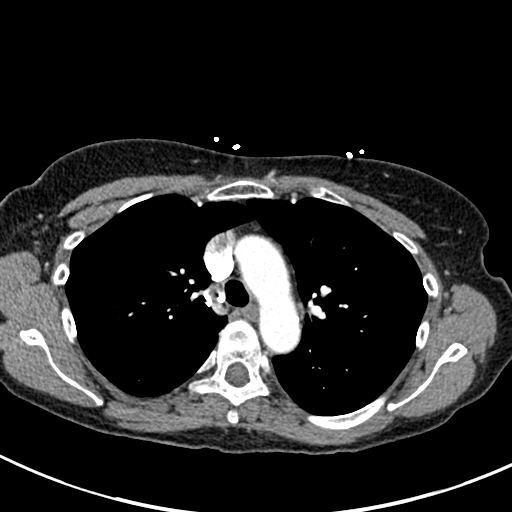
[im 115/161  lung]
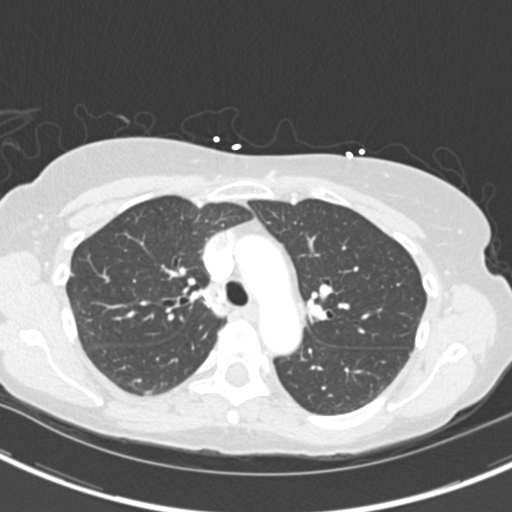
[im 126/161  lung]
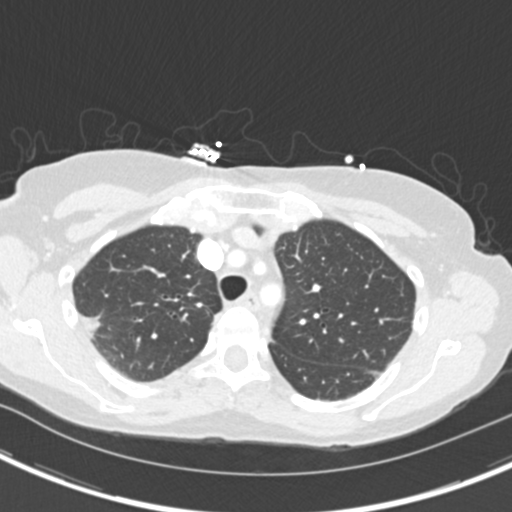
[im 138/161  lung]
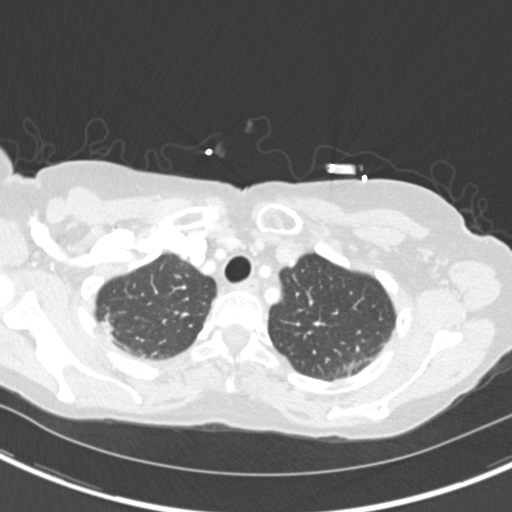
[im 149/161  lung]
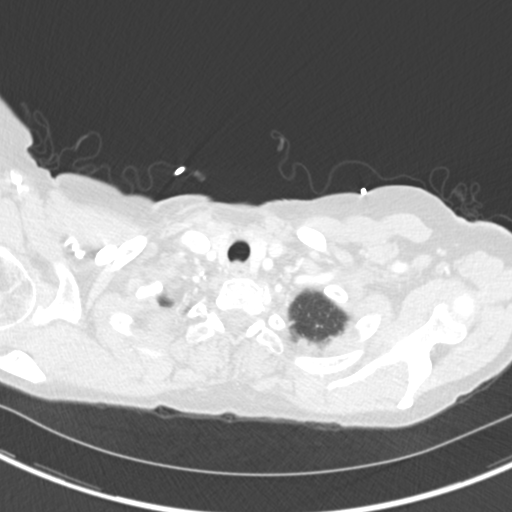

[Series 6: coronal · coronal · 0.60mm/px · 3 of 110 slices shown]
[im 22/110  lung]
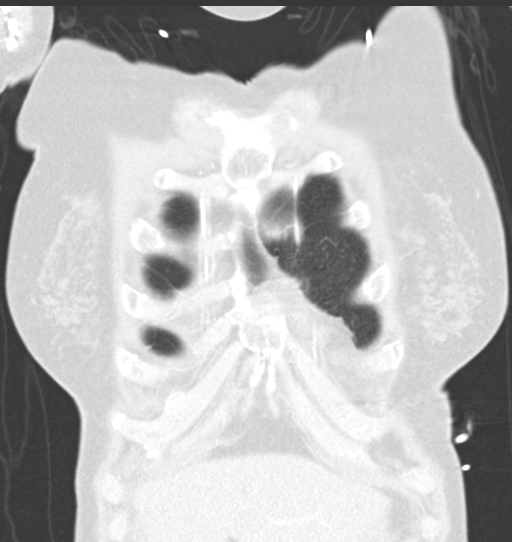
[im 44/110  lung]
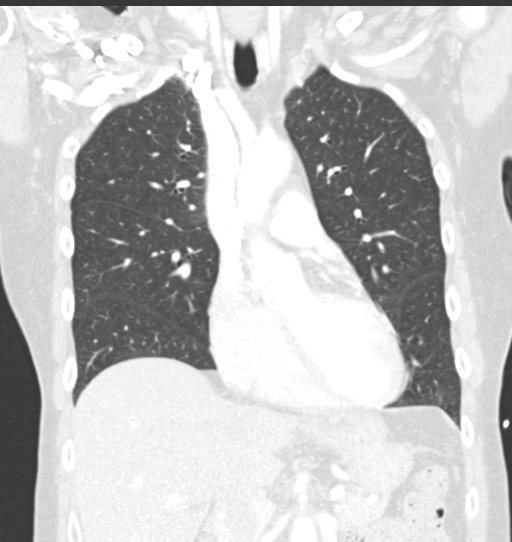
[im 66/110  lung]
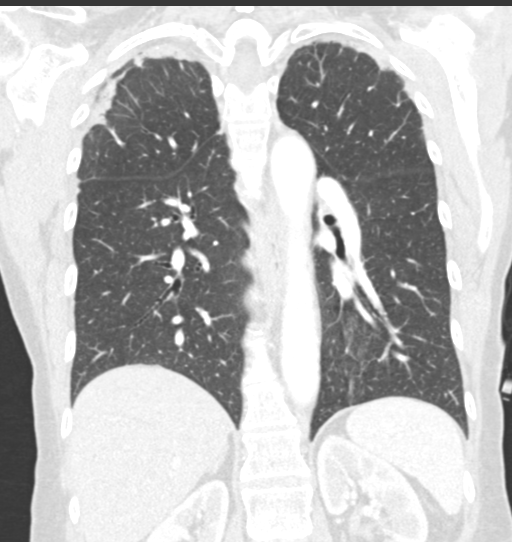

[15 of 36 positions shown; findings below may reference images not displayed]

FINDINGS: Cardiovascular: Heart is normal size. Thoracic aorta is normal in
caliber. Minimal calcified plaque over the thoracic aorta. Pulmonary
arterial system is within normal. Remaining vascular structures are
unremarkable.

Mediastinum/Nodes: No mediastinal or hilar adenopathy. Remaining
mediastinal structures are normal.

Lungs/Pleura: Lungs are well inflated without focal airspace
consolidation or effusion. There is biapical pleural thickening
right greater than left. No focal mass visualized over the lung
apices, although there is mild focal nodularity over the right
apical pleural thickening measuring approximately 1.3 cm. This is
likely pleuroparenchymal scarring from previous
inflammatory/infectious process and not developing malignancy. Small
focus of pleural calcification over the left apex. Airways are
normal.

Upper Abdomen: Calcified plaque over the abdominal aorta. No acute
findings.

Musculoskeletal: Unremarkable.
IMPRESSION: No acute cardiopulmonary disease.

Biapical pleural thickening right greater than left with subtle
cm focus of nodularity over the right apex likely part of this
pleuroparenchymal scarring and much less likely developing
malignancy. Recommend a six-month follow-up noncontrast chest CT to
document stability.

Aortic Atherosclerosis (16STM-3D6.6).

## 2021-01-02 ENCOUNTER — Telehealth: Payer: Self-pay | Admitting: *Deleted

## 2021-01-02 NOTE — Telephone Encounter (Addendum)
Deductible n/a  OOP MAX n/a  Annual exam   01/15/2021  Calcium  9.3           Date 06/2020  Upcoming dental procedures   Prior Authorization needed YES previous one extended until 09/08/2021 auth [id number] [facility] # [id number]  Pt estimated Cost $0    Pt states to call her week of June 21st to schedule appt for prolia will be out of town until than    Appt 02/26/2021  Coverage Details: 0% one dose, 0% admin fee

## 2021-01-15 ENCOUNTER — Other Ambulatory Visit: Payer: Self-pay

## 2021-01-15 ENCOUNTER — Other Ambulatory Visit (HOSPITAL_COMMUNITY)
Admission: RE | Admit: 2021-01-15 | Discharge: 2021-01-15 | Disposition: A | Discharge status: Home / Self Care | Payer: Medicare PPO | Source: Ambulatory Visit | Attending: Nurse Practitioner | Admitting: Nurse Practitioner

## 2021-01-15 ENCOUNTER — Ambulatory Visit (INDEPENDENT_AMBULATORY_CARE_PROVIDER_SITE_OTHER): Payer: Medicare PPO | Admitting: Nurse Practitioner

## 2021-01-15 ENCOUNTER — Telehealth: Payer: Self-pay | Admitting: *Deleted

## 2021-01-15 ENCOUNTER — Encounter: Payer: Self-pay | Admitting: Nurse Practitioner

## 2021-01-15 VITALS — BP 120/74 | Ht 67.0 in | Wt 139.0 lb

## 2021-01-15 DIAGNOSIS — Z01419 Encounter for gynecological examination (general) (routine) without abnormal findings: Secondary | ICD-10-CM | POA: Insufficient documentation

## 2021-01-15 DIAGNOSIS — N644 Mastodynia: Secondary | ICD-10-CM

## 2021-01-15 NOTE — Patient Instructions (Signed)
Catherine [name] , Thank you for taking time to come for your Medicare Wellness Visit. I appreciate your ongoing commitment to your health goals. Please review the following plan we discussed and let me know if I can assist you in the future.    Health Maintenance After Age 71 After age 51, you are at a higher risk for certain long-term diseases and infections as well as injuries from falls. Falls are a major cause of broken bones and head injuries in people who are older than age 69. Getting regular preventive care can help to keep you healthy and well. Preventive care includes getting regular testing and making lifestyle changes as recommended by your health care provider. Talk with your health care provider about:  Which screenings and tests you should have. A screening is a test that checks for a disease when you have no symptoms.  A diet and exercise plan that is right for you. What should I know about screenings and tests to prevent falls? Screening and testing are the best ways to find a health problem early. Early diagnosis and treatment give you the best chance of managing medical conditions that are common after age 23. Certain conditions and lifestyle choices may make you more likely to have a fall. Your health care provider may recommend:  Regular vision checks. Poor vision and conditions such as cataracts can make you more likely to have a fall. If you wear glasses, make sure to get your prescription updated if your vision changes.  Medicine review. Work with your health care provider to regularly review all of the medicines you are taking, including over-the-counter medicines. Ask your health care provider about any side effects that may make you more likely to have a fall. Tell your health care provider if any medicines that you take make you feel dizzy or sleepy.  Osteoporosis screening. Osteoporosis is a condition that causes the bones to get weaker. This can make the bones weak and  cause them to break more easily.  Blood pressure screening. Blood pressure changes and medicines to control blood pressure can make you feel dizzy.  Strength and balance checks. Your health care provider may recommend certain tests to check your strength and balance while standing, walking, or changing positions.  Foot health exam. Foot pain and numbness, as well as not wearing proper footwear, can make you more likely to have a fall.  Depression screening. You may be more likely to have a fall if you have a fear of falling, feel emotionally low, or feel unable to do activities that you used to do.  Alcohol use screening. Using too much alcohol can affect your balance and may make you more likely to have a fall. What actions can I take to lower my risk of falls? General instructions  Talk with your health care provider about your risks for falling. Tell your health care provider if: ? You fall. Be sure to tell your health care provider about all falls, even ones that seem minor. ? You feel dizzy, sleepy, or off-balance.  Take over-the-counter and prescription medicines only as told by your health care provider. These include any supplements.  Eat a healthy diet and maintain a healthy weight. A healthy diet includes low-fat dairy products, low-fat (lean) meats, and fiber from whole grains, beans, and lots of fruits and vegetables. Home safety  Remove any tripping hazards, such as rugs, cords, and clutter.  Install safety equipment such as grab bars in bathrooms and safety rails  on stairs.  Keep rooms and walkways well-lit. Activity  Follow a regular exercise program to stay fit. This will help you maintain your balance. Ask your health care provider what types of exercise are appropriate for you.  If you need a cane or walker, use it as recommended by your health care provider.  Wear supportive shoes that have nonskid soles.   Lifestyle  Do not drink alcohol if your health care  provider tells you not to drink.  If you drink alcohol, limit how much you have: ? 0-1 drink a day for women. ? 0-2 drinks a day for men.  Be aware of how much alcohol is in your drink. In the [place] one drink equals one typical bottle of beer (12 oz), one-half glass of wine (5 oz), or one shot of hard liquor (1 oz).  Do not use any products that contain nicotine or tobacco, such as cigarettes and e-cigarettes. If you need help quitting, ask your health care provider. Summary  Having a healthy lifestyle and getting preventive care can help to protect your health and wellness after age 67.  Screening and testing are the best way to find a health problem early and help you avoid having a fall. Early diagnosis and treatment give you the best chance for managing medical conditions that are more common for people who are older than age 45.  Falls are a major cause of broken bones and head injuries in people who are older than age 69. Take precautions to prevent a fall at home.  Work with your health care provider to learn what changes you can make to improve your health and wellness and to prevent falls. This information is not intended to replace advice given to you by your health care provider. Make sure you discuss any questions you have with your health care provider. Document Revised: 12/17/2018 Document Reviewed: 07/09/2017 Elsevier Patient Education  2021 [employer].  Breast Tenderness Breast tenderness is a common problem for women of all ages, but may also occur in men. Breast tenderness may range from mild discomfort to severe pain. In women, the pain usually comes and goes with the menstrual cycle, but it can also be constant. Breast tenderness has many possible causes, including hormone changes, infections, and taking certain medicines. You may have tests, such as a mammogram or an ultrasound, to check for any unusual findings. Having breast tenderness usually does not mean that you  have breast cancer. Follow these instructions at home: Managing pain and discomfort  If directed, put ice to the painful area. To do this: ? Put ice in a plastic bag. ? Place a towel between your skin and the bag. ? Leave the ice on for 20 minutes, 2-3 times a day.  Wear a supportive bra, especially during exercise. You may also want to wear a supportive bra while sleeping if your breasts are very tender.   Medicines  Take over-the-counter and prescription medicines only as told by your health care provider. If the cause of your pain is infection, you may be prescribed an antibiotic medicine.  If you were prescribed an antibiotic, take it as told by your health care provider. Do not stop taking the antibiotic even if you start to feel better. Eating and drinking  Your health care provider may recommend that you lessen the amount of fat in your diet. You can do this by: ? Limiting fried foods. ? Cooking foods using methods such as baking, boiling, grilling, and broiling.  Decrease the amount of caffeine in your diet. Instead, drink more water and choose caffeine-free drinks. General instructions  Keep a log of the days and times when your breasts are most tender.  Ask your health care provider how to do breast exams at home. This will help you notice if you have an unusual growth or lump.  Keep all follow-up visits as told by your health care provider. This is important.   Contact a health care provider if:  Any part of your breast is hard, red, and hot to the touch. This may be a sign of infection.  You are a woman and: ? Not breastfeeding and you have fluid, especially blood or pus, coming out of your nipples. ? Have a new or painful lump in your breast that remains after your menstrual period ends.  You have a fever.  Your pain does not improve or it gets worse.  Your pain is interfering with your daily activities. Summary  Breast tenderness may range from mild  discomfort to severe pain.  Breast tenderness has many possible causes, including hormone changes, infections, and taking certain medicines.  It can be treated with ice, wearing a supportive bra, and medicines.  Make changes to your diet if told to by your health care provider. This information is not intended to replace advice given to you by your health care provider. Make sure you discuss any questions you have with your health care provider. Document Revised: 01/18/2019 Document Reviewed: 01/18/2019 Elsevier Patient Education  2021 [employer].   These are the goals we discussed: Goals    . patient     Continue to maintain current health status by exercising and eating healthy. Ensure that I get adequate sleep, enjoy life, family and friends.        This is a list of the screening recommended for you and due dates:  Health Maintenance  Topic Date Due  . COVID-19 Vaccine (3 - Pfizer risk 4-dose series) 12/02/2019  . Tetanus Vaccine  01/28/2021  . Flu Shot  04/09/2021  . Mammogram  10/19/2021  . DEXA scan (bone density measurement)  10/19/2021  . Colon Cancer Screening  11/04/2027  . Hepatitis C Screening: USPSTF Recommendation to screen - Ages 18-79 yo.  Completed  . Pneumonia vaccines  Completed  . HPV Vaccine  Aged Out

## 2021-01-15 NOTE — Progress Notes (Signed)
70 y.o. G0P0 Married White or Caucasian female here for annual exam.      Patient's last menstrual period was 09/09/1996.          Has some tenderness in Right breast x 4-5 months, intermittent, only hurts when touch, can not think of any changes in activity, no new bra, no nipple discharge. Went to [place] for mammogram around February or March 2022 Will be traveling and will be back Feb 26, 2021    Sexually active: Yes.    The current method of family planning is none.    Exercising: Yes.    Home exercise routine includes walking 5 hrs per week. Smoker:  no  Health Maintenance: Pap:10-21-17 norm History of abnormal Pap:  no MMG:  2-2021norm Colonoscopy:2019 BMD:10-2019 Gardasil:never Covid-19:Phizer Hep C testing:never    reports that she has never smoked. She has never used smokeless tobacco. She reports current alcohol use. She reports that she does not use drugs.  Past Medical History:  Diagnosis Date  . Colon polyp 2017  . Osteoporosis 10/2017   T score -2.9    Past Surgical History:  Procedure Laterality Date  . COLONOSCOPY    . COLONOSCOPY WITH PROPOFOL N/A 06/17/2016   Procedure: COLONOSCOPY WITH PROPOFOL;  Surgeon: Gatha Mayer, MD;  Location: [facility];  Service: Endoscopy;  Laterality: N/A;  . COLONOSCOPY WITH PROPOFOL N/A 11/03/2017   Procedure: COLONOSCOPY WITH PROPOFOL;  Surgeon: Gatha Mayer, MD;  Location: [facility];  Service: Endoscopy;  Laterality: N/A;  . HOT HEMOSTASIS N/A 06/17/2016   Procedure: HOT HEMOSTASIS (ARGON PLASMA COAGULATION/BICAP);  Surgeon: Gatha Mayer, MD;  Location: [facility];  Service: Endoscopy;  Laterality: N/A;  . HOT HEMOSTASIS N/A 11/03/2017   Procedure: HOT HEMOSTASIS (ARGON PLASMA COAGULATION/BICAP);  Surgeon: Gatha Mayer, MD;  Location: [facility];  Service: Endoscopy;  Laterality: N/A;  . POLYPECTOMY      Current Outpatient Medications  Medication Sig Dispense Refill  . calcium gluconate 500 MG tablet  Take 500 mg by mouth daily.    . Cholecalciferol (VITAMIN D) 1000 UNITS capsule Take 1,000 Units by mouth daily.    Marland Kitchen denosumab (PROLIA) 60 MG/ML SOLN injection Inject 60 mg into the skin every 6 (six) months. Administer in upper arm, thigh, or abdomen    . glucosamine-chondroitin 500-400 MG tablet Take 1 tablet by mouth daily.    . magnesium 30 MG tablet Take 30 mg by mouth 2 (two) times daily.    . Multiple Vitamin (MULTIVITAMIN) capsule Take 1 capsule by mouth daily.    . Omega-3 Fatty Acids (FISH OIL) 1000 MG CAPS Take 1,000 mg by mouth daily.    Marland Kitchen POTASSIUM PO Take by mouth.    . Turmeric 500 MG CAPS Take 500 mg by mouth daily.      No current facility-administered medications for this visit.    Family History  Problem Relation Age of Onset  . Hypothyroidism Mother   . Thyroid disease Mother   . Lung cancer Father        smoker  . Thyroid disease Sister   . Thyroid disease Sister   . Thyroid disease Sister   . Kidney Stones Sister   . Colon cancer Neg Hx   . Colon polyps Neg Hx   . Esophageal cancer Neg Hx   . Rectal cancer Neg Hx   . Stomach cancer Neg Hx     Review of Systems  Genitourinary:       Brest  pain    Exam:   BP 120/74   Ht 5\' 7"  (1.702 m)   Wt 139 lb (63 kg)   LMP 09/09/1996   BMI 21.77 kg/m   Height: 5\' 7"  (170.2 cm)  General appearance: alert, cooperative and appears stated age, no acute distress  Breasts: normal appearance, no masses or tenderness, Normal to palpation without dominant masses, pt tender in localized area of right breast upper outer area, close to midaxillary line. Neurologic: Grossly normal   Pelvic: External genitalia:  no lesions              Urethra:  normal appearing urethra with no masses, tenderness or lesions              Bartholins and Skenes: normal                 Vagina: normal appearing vagina, appropriate for age, normal appearing discharge, no lesions              Cervix: neg cervical motion tenderness, no visible  lesions             Bimanual Exam:   Uterus:  normal size, contour, position, consistency, mobility, non-tender              Adnexa: no mass, fullness, tenderness              Rectal: no palpable mass   Maudie Mercury, CMA Chaperone was present for exam.  A:  Well Woman with normal exam  Osteoporosis on Prolia  Breast pain, Right Breast  P:   Pap : last done 2019, discussed may discontinue R/T age, but wants to do pap today, will make future desisions later.  Mammogram: will schedule diagnostic Mammogram/ Right breast US for Breast pain  Labs: with PCP  Medications: Con. Prolia

## 2021-01-15 NOTE — Telephone Encounter (Signed)
Call to patient. Patient advised of appointment at [facility] on 03-05-21 at [facility] for diagnostic MMG and right breast ultrasound. Patient agreeable to date and time of appointment. Signed order faxed to [facility], [phone].   Routing to provider and will close encounter.

## 2021-01-15 NOTE — Telephone Encounter (Signed)
-----   Message from Karma Ganja, NP sent at 01/15/2021 10:54 AM EDT ----- Pt has had breast pain x 4-5 months. Had screening mammogram in 10/2020, but pain is persistent and she is concerned. The location is localized upper outer quadrant almost at mid-axillary line (concern may not have been seen on mammogram)  We do not have last mammogram report from [place].  She is going out of town until June 20, can a Diagnostic mammogram and Right Breat [place] be set up for after that?  Thanks

## 2021-01-16 LAB — CYTOLOGY - PAP: Diagnosis: NEGATIVE

## 2021-02-08 ENCOUNTER — Encounter: Payer: Self-pay | Admitting: Internal Medicine

## 2021-02-26 ENCOUNTER — Ambulatory Visit (INDEPENDENT_AMBULATORY_CARE_PROVIDER_SITE_OTHER): Payer: Medicare PPO | Admitting: Anesthesiology

## 2021-02-26 ENCOUNTER — Other Ambulatory Visit: Payer: Self-pay

## 2021-02-26 DIAGNOSIS — M810 Age-related osteoporosis without current pathological fracture: Secondary | ICD-10-CM

## 2021-02-26 MED ORDER — DENOSUMAB 60 MG/ML ~~LOC~~ SOSY
60.0000 mg | PREFILLED_SYRINGE | Freq: Once | SUBCUTANEOUS | Status: AC
  Administered 2021-02-26: 11:00:00 60 mg via SUBCUTANEOUS

## 2021-03-05 DIAGNOSIS — R922 Inconclusive mammogram: Secondary | ICD-10-CM | POA: Diagnosis not present

## 2021-03-05 DIAGNOSIS — N644 Mastodynia: Secondary | ICD-10-CM | POA: Diagnosis not present

## 2021-03-05 LAB — HM MAMMOGRAPHY

## 2021-03-14 ENCOUNTER — Encounter: Payer: Self-pay | Admitting: Internal Medicine

## 2021-05-08 ENCOUNTER — Ambulatory Visit (AMBULATORY_SURGERY_CENTER): Payer: Medicare PPO | Admitting: *Deleted

## 2021-05-08 ENCOUNTER — Other Ambulatory Visit: Payer: Self-pay

## 2021-05-08 VITALS — Ht 67.0 in | Wt 138.0 lb

## 2021-05-08 DIAGNOSIS — Z86010 Personal history of colonic polyps: Secondary | ICD-10-CM

## 2021-05-08 NOTE — Progress Notes (Signed)
No egg or soy allergy known to patient  No issues with past sedation with any surgeries or procedures Patient denies ever being told they had issues or difficulty with intubation  No FH of Malignant Hyperthermia No diet pills per patient No home 02 use per patient  No blood thinners per patient  Pt denies issues with constipation  No A fib or A flutter  EMMI video to pt or via [place] 19 guidelines implemented in [place] today with Pt and RN   Pt is fully vaccinated  for Covid   Due to the COVID-19 pandemic we are asking patients to follow certain guidelines.  Pt aware of COVID protocols and [facility] guidelines   Pt verified name, DOB, address and insurance during PV today.  Pt mailed instruction packet of Emmi video, copy of consent form to read and not return, and instructions.  PV completed over the phone.  Pt encouraged to call with questions or issues.  My Chart instructions to pt as well

## 2021-05-22 ENCOUNTER — Other Ambulatory Visit: Payer: Self-pay

## 2021-05-22 ENCOUNTER — Ambulatory Visit (AMBULATORY_SURGERY_CENTER): Payer: Medicare PPO | Admitting: Internal Medicine

## 2021-05-22 ENCOUNTER — Encounter: Payer: Self-pay | Admitting: Internal Medicine

## 2021-05-22 VITALS — BP 116/55 | HR 56 | Temp 98.4°F | Resp 12 | Ht 67.0 in | Wt 136.0 lb

## 2021-05-22 DIAGNOSIS — E039 Hypothyroidism, unspecified: Secondary | ICD-10-CM | POA: Diagnosis not present

## 2021-05-22 DIAGNOSIS — Z86010 Personal history of colonic polyps: Secondary | ICD-10-CM

## 2021-05-22 MED ORDER — SODIUM CHLORIDE 0.9 % IV SOLN: 500.0000 mL | Freq: Once | INTRAVENOUS | Status: DC

## 2021-05-22 NOTE — Progress Notes (Signed)
VS completed by CW.   Pt's states no medical or surgical changes since previsit or office visit.  

## 2021-05-22 NOTE — Op Note (Signed)
[facility] Patient Name: Catherine [name] Procedure Date: 05/22/2021 11:41 AM MRN: [id number] Endoscopist: Gatha Mayer , MD Age: 70 Referring MD:  Date of Birth: [birth year] Gender: Female Account #: [id number] Procedure:                Colonoscopy Indications:              Surveillance: Personal history of adenomatous                            polyps on last colonoscopy 3 years ago Medicines:                Propofol per Anesthesia, Monitored Anesthesia Care Procedure:                Pre-Anesthesia Assessment:                           - Prior to the procedure, a History and Physical                            was performed, and patient medications and                            allergies were reviewed. The patient's tolerance of                            previous anesthesia was also reviewed. The risks                            and benefits of the procedure and the sedation                            options and risks were discussed with the patient.                            All questions were answered, and informed consent                            was obtained. Prior Anticoagulants: The patient has                            taken no previous anticoagulant or antiplatelet                            agents. ASA Grade Assessment: I - A normal, healthy                            patient. After reviewing the risks and benefits,                            the patient was deemed in satisfactory condition to                            undergo the procedure.  After obtaining informed consent, the colonoscope                            was passed under direct vision. Throughout the                            procedure, the patient's blood pressure, pulse, and                            oxygen saturations were monitored continuously. The                            Olympus [id number]) Colonoscope was                            introduced  through the anus and advanced to the the                            cecum, identified by appendiceal orifice and                            ileocecal valve. The colonoscopy was somewhat                            difficult due to significant looping. Successful                            completion of the procedure was aided by applying                            abdominal pressure. The patient tolerated the                            procedure well. The quality of the bowel                            preparation was good. The ileocecal valve,                            appendiceal orifice, and rectum were photographed. Scope In: 11:45:35 AM Scope Out: 11:59:23 AM Scope Withdrawal Time: 0 hours 6 minutes 51 seconds  Total Procedure Duration: 0 hours 13 minutes 48 seconds  Findings:                 The perianal and digital rectal examinations were                            normal.                           The entire examined colon appeared normal on direct                            and retroflexion views. Complications:            No  immediate complications. Estimated Blood Loss:     Estimated blood loss: none. Impression:               - The entire examined colon is normal on direct and                            retroflexion views.                           - No specimens collected.                           - Personal history of colonic polyps. colonoscopy                            08/07/06 > tubulovillous                           - Repeat colonscopy 10/ 6/11 neg                           10/2015 2 flat ssp's > 1 cm and cecal ssp                            incompletely removed                           06/2016 - further removal and ablation of cecal ssp                            11/03/2017 no residual cecal polyp , new diminutive                            transverse polyp removed negative for polyp tissue Recommendation:           - Patient has a contact number available for                             emergencies. The signs and symptoms of potential                            delayed complications were discussed with the                            patient. Return to normal activities tomorrow.                            Written discharge instructions were provided to the                            patient.                           - Resume previous diet.                           -  Continue present medications.                           - No repeat colonoscopy due to age and the absence                            of colonic polyps. Gatha Mayer, MD 05/22/2021 12:02:47 PM This report has been signed electronically.

## 2021-05-22 NOTE — Patient Instructions (Addendum)
No polyps today and no more routine colonoscopy!  I appreciate the opportunity to care for you. Gatha Mayer, MD, [facility]   Discharge instructions given. Normal exam. Resume previous medications. YOU HAD AN ENDOSCOPIC PROCEDURE TODAY AT [facility]:   Refer to the procedure report that was given to you for any specific questions about what was found during the examination.  If the procedure report does not answer your questions, please call your gastroenterologist to clarify.  If you requested that your care partner not be given the details of your procedure findings, then the procedure report has been included in a sealed envelope for you to review at your convenience later.  YOU SHOULD EXPECT: Some feelings of bloating in the abdomen. Passage of more gas than usual.  Walking can help get rid of the air that was put into your GI tract during the procedure and reduce the bloating. If you had a lower endoscopy (such as a colonoscopy or flexible sigmoidoscopy) you may notice spotting of blood in your stool or on the toilet paper. If you underwent a bowel prep for your procedure, you may not have a normal bowel movement for a few days.  Please Note:  You might notice some irritation and congestion in your nose or some drainage.  This is from the oxygen used during your procedure.  There is no need for concern and it should clear up in a day or so.  SYMPTOMS TO REPORT IMMEDIATELY:  Following lower endoscopy (colonoscopy or flexible sigmoidoscopy):  Excessive amounts of blood in the stool  Significant tenderness or worsening of abdominal pains  Swelling of the abdomen that is new, acute  Fever of 100F or higher   For urgent or emergent issues, a gastroenterologist can be reached at any hour by calling [phone]. Do not use MyChart messaging for urgent concerns.    DIET:  We do recommend a small meal at first, but then you may proceed to your regular diet.  Drink plenty  of fluids but you should avoid alcoholic beverages for 24 hours.  ACTIVITY:  You should plan to take it easy for the rest of today and you should NOT DRIVE or use heavy machinery until tomorrow (because of the sedation medicines used during the test).    FOLLOW UP: Our staff will call the number listed on your records 48-72 hours following your procedure to check on you and address any questions or concerns that you may have regarding the information given to you following your procedure. If we do not reach you, we will leave a message.  We will attempt to reach you two times.  During this call, we will ask if you have developed any symptoms of COVID 19. If you develop any symptoms (ie: fever, flu-like symptoms, shortness of breath, cough etc.) before then, please call [phone].  If you test positive for Covid 19 in the 2 weeks post procedure, please call and report this information to [place].    If any biopsies were taken you will be contacted by phone or by letter within the next 1-3 weeks.  Please call us at [phone] if you have not heard about the biopsies in 3 weeks.    SIGNATURES/CONFIDENTIALITY: You and/or your care partner have signed paperwork which will be entered into your electronic medical record.  These signatures attest to the fact that that the information above on your After Visit Summary has been reviewed and is understood.  Full responsibility  of the confidentiality of this discharge information lies with you and/or your care-partner.

## 2021-05-22 NOTE — Progress Notes (Signed)
Report given to PACU, vss 

## 2021-05-22 NOTE — Progress Notes (Signed)
[facility] History and Physical   Primary Care Physician:  Binnie Rail, MD   Reason for Procedure:   Hx colon polyps  Plan:    colonoscopy     HPI: Catherine [name] is a 70 y.o. female w/ hx colon polyps here for surveillance   colonoscopy 08/07/06 > tubulovillous  - Repeat colonscopy 10/ 6/11 neg  10/2015 2 flat ssp's > 1 cm and cecal ssp incompletely removed  06/2016 - further removal and ablation of cecal ssp - 11/03/2017 no residual cecal polyp , new diminutive transverse polyp removed negative for polyp tissue    Past Medical History:  Diagnosis Date   Cancer ([place])    basal cell back   Colon polyp 2017   Osteoporosis 10/2017   T score -2.9    Past Surgical History:  Procedure Laterality Date   COLONOSCOPY     COLONOSCOPY WITH PROPOFOL N/A 06/17/2016   Procedure: COLONOSCOPY WITH PROPOFOL;  Surgeon: Gatha Mayer, MD;  Location: [facility];  Service: Endoscopy;  Laterality: N/A;   COLONOSCOPY WITH PROPOFOL N/A 11/03/2017   Procedure: COLONOSCOPY WITH PROPOFOL;  Surgeon: Gatha Mayer, MD;  Location: [facility];  Service: Endoscopy;  Laterality: N/A;   HOT HEMOSTASIS N/A 06/17/2016   Procedure: HOT HEMOSTASIS (ARGON PLASMA COAGULATION/BICAP);  Surgeon: Gatha Mayer, MD;  Location: [facility];  Service: Endoscopy;  Laterality: N/A;   HOT HEMOSTASIS N/A 11/03/2017   Procedure: HOT HEMOSTASIS (ARGON PLASMA COAGULATION/BICAP);  Surgeon: Gatha Mayer, MD;  Location: [facility];  Service: Endoscopy;  Laterality: N/A;   POLYPECTOMY      Prior to Admission medications   Medication Sig Start Date End Date Taking? Authorizing Provider  calcium gluconate 500 MG tablet Take 500 mg by mouth daily.   Yes [provider]  Cholecalciferol (VITAMIN D) 1000 UNITS capsule Take 1,000 Units by mouth daily.   Yes [provider]  glucosamine-chondroitin 500-400 MG tablet Take 1 tablet by mouth daily.   Yes [provider]  magnesium  30 MG tablet Take 30 mg by mouth 2 (two) times daily.   Yes [provider]  Multiple Vitamin (MULTIVITAMIN) capsule Take 1 capsule by mouth daily.   Yes [provider]  Omega-3 Fatty Acids (FISH OIL) 1000 MG CAPS Take 1,000 mg by mouth daily.   Yes [provider]  POTASSIUM PO Take by mouth.   Yes [provider]  Turmeric 500 MG CAPS Take 500 mg by mouth daily.    Yes [provider]  denosumab (PROLIA) 60 MG/ML SOLN injection Inject 60 mg into the skin every 6 (six) months. Administer in upper arm, thigh, or abdomen    [provider]    Current Outpatient Medications  Medication Sig Dispense Refill   calcium gluconate 500 MG tablet Take 500 mg by mouth daily.     Cholecalciferol (VITAMIN D) 1000 UNITS capsule Take 1,000 Units by mouth daily.     glucosamine-chondroitin 500-400 MG tablet Take 1 tablet by mouth daily.     magnesium 30 MG tablet Take 30 mg by mouth 2 (two) times daily.     Multiple Vitamin (MULTIVITAMIN) capsule Take 1 capsule by mouth daily.     Omega-3 Fatty Acids (FISH OIL) 1000 MG CAPS Take 1,000 mg by mouth daily.     POTASSIUM PO Take by mouth.     Turmeric 500 MG CAPS Take 500 mg by mouth daily.      denosumab (PROLIA) 60 MG/ML SOLN  injection Inject 60 mg into the skin every 6 (six) months. Administer in upper arm, thigh, or abdomen     Current Facility-Administered Medications  Medication Dose Route Frequency Provider Last Rate Last Admin   0.9 %  sodium chloride infusion  500 mL Intravenous Once Gatha Mayer, MD        Allergies as of 05/22/2021 - Review Complete 05/22/2021  Allergen Reaction Noted   Jeanie Cooks allergy] Nausea And Vomiting and Other (See Comments)    Avocado Nausea And Vomiting    Latex Rash 10/21/2017    Family History  Problem Relation Age of Onset   Hypothyroidism Mother    Thyroid disease Mother    Lung cancer Father        smoker   Thyroid disease Sister     Thyroid disease Sister    Thyroid disease Sister    Kidney Stones Sister    Colon cancer Neg Hx    Colon polyps Neg Hx    Esophageal cancer Neg Hx    Rectal cancer Neg Hx    Stomach cancer Neg Hx     Social History   Socioeconomic History   Marital status: Married    Spouse name: Not on file   Number of children: Not on file   Years of education: Not on file   Highest education level: Not on file  Occupational History   Occupation: retired  Tobacco Use   Smoking status: Never   Smokeless tobacco: Never  Vaping Use   Vaping Use: Never used  Substance and Sexual Activity   Alcohol use: Yes    Alcohol/week: 0.0 standard drinks    Comment: wine with dinner   Drug use: No   Sexual activity: Yes    Birth control/protection: Post-menopausal    Comment: 1st intercourse 70 yo-Fewer than 5 partners  Other Topics Concern   Not on file  Social History Narrative   Exercises regularly   Social Determinants of Health   Financial Resource Strain: Low Risk    Difficulty of Paying Living Expenses: Not hard at all  Food Insecurity: No Food Insecurity   Worried About Charity fundraiser in the Last Year: Never true   [place] in the Last Year: Never true  Transportation Needs: No Transportation Needs   Lack of Transportation (Medical): No   Lack of Transportation (Non-Medical): No  Physical Activity: Sufficiently Active   Days of Exercise per Week: 6 days   Minutes of Exercise per Session: 30 min  Stress: No Stress Concern Present   Feeling of Stress : Not at all  Social Connections: Socially Integrated   Frequency of Communication with Friends and Family: More than three times a week   Frequency of Social Gatherings with Friends and Family: More than three times a week   Attends Religious Services: More than 4 times per year   Active Member of [employer] or Organizations: Yes   Attends Music therapist: More than 4 times per year   Marital Status: Married   Human resources officer Violence: Not on file    Review of Systems:  All other review of systems negative except as mentioned in the HPI.  Physical Exam: Vital signs BP 122/75   Pulse (!) 58   Temp 98.4 F (36.9 C) (Temporal)   Ht '5\' 7"'$  (1.702 m)   Wt 136 lb (61.7 kg)   LMP 09/09/1996   SpO2 100%   BMI 21.30 kg/m   General:  Alert,  Well-developed, well-nourished, pleasant and cooperative in NAD Lungs:  Clear throughout to auscultation.   Heart:  Regular rate and rhythm; no murmurs, clicks, rubs,  or gallops. Abdomen:  Soft, nontender and nondistended. Normal bowel sounds.   Neuro/Psych:  Alert and cooperative. Normal mood and affect. A and O x 3   '@Josimar Corning'$  Simonne Maffucci, MD, [facility] [phone] (pager) 05/22/2021 11:39 AM@

## 2021-05-24 ENCOUNTER — Telehealth: Payer: Self-pay | Admitting: *Deleted

## 2021-05-24 NOTE — Telephone Encounter (Signed)
  Follow up Call-  Call back number 05/22/2021  Post procedure Call Back phone  # [phone]  Permission to leave phone message Yes  Some recent data might be hidden     Patient questions:  Do you have a fever, pain , or abdominal swelling? No. Pain Score  0 *  Have you tolerated food without any problems? Yes.    Have you been able to return to your normal activities? Yes.    Do you have any questions about your discharge instructions: Diet   No. Medications  No. Follow up visit  No.  Do you have questions or concerns about your Care? No.  Actions: * If pain score is 4 or above: No action needed, pain <4.  Have you developed a fever since your procedure? no  2.   Have you had an respiratory symptoms (SOB or cough) since your procedure? no  3.   Have you tested positive for COVID 19 since your procedure no  4.   Have you had any family members/close contacts diagnosed with the COVID 19 since your procedure?  no   If yes to any of these questions please route to Joylene John, RN and Joella Prince, RN

## 2021-05-24 NOTE — Telephone Encounter (Signed)
Attempted to call patient for the post-procedure follow-up call. No answer. Left voicemail.  

## 2021-05-25 DIAGNOSIS — Z86018 Personal history of other benign neoplasm: Secondary | ICD-10-CM | POA: Diagnosis not present

## 2021-05-25 DIAGNOSIS — L821 Other seborrheic keratosis: Secondary | ICD-10-CM | POA: Diagnosis not present

## 2021-05-25 DIAGNOSIS — L578 Other skin changes due to chronic exposure to nonionizing radiation: Secondary | ICD-10-CM | POA: Diagnosis not present

## 2021-05-25 DIAGNOSIS — D225 Melanocytic nevi of trunk: Secondary | ICD-10-CM | POA: Diagnosis not present

## 2021-05-25 DIAGNOSIS — L814 Other melanin hyperpigmentation: Secondary | ICD-10-CM | POA: Diagnosis not present

## 2021-05-25 DIAGNOSIS — Z85828 Personal history of other malignant neoplasm of skin: Secondary | ICD-10-CM | POA: Diagnosis not present

## 2021-06-13 ENCOUNTER — Telehealth: Payer: Self-pay | Admitting: *Deleted

## 2021-06-13 NOTE — Telephone Encounter (Signed)
ERROR

## 2021-06-29 ENCOUNTER — Telehealth: Payer: Self-pay | Admitting: *Deleted

## 2021-06-29 NOTE — Telephone Encounter (Addendum)
Deductible n/a  OOP MAX n/a  Annual exam 01/15/2021  Calcium   9.5        Date 07/18/2021  Upcoming dental procedures NO  Prior Authorization needed  YES previous one extended until 09/08/2021 auth #[id number] EOC # [id number]  Pt estimated Cost $0  Appt 08/29/2021    Coverage Details:: Product will be covered at 100% of the contracted rate. No deductible or coinsurance applies.

## 2021-06-29 NOTE — Telephone Encounter (Signed)
Prolia insurance verification has been sent awaiting Summary of benefits  

## 2021-06-29 NOTE — Telephone Encounter (Signed)
PROLIA GIVEN 02/26/21 NEXT INJECTION 08/29/2021

## 2021-07-03 ENCOUNTER — Encounter: Payer: Medicare PPO | Admitting: Internal Medicine

## 2021-07-17 NOTE — Progress Notes (Signed)
Subjective:    Patient ID: Catherine [name], female    DOB: 27-Aug-1951, 70 y.o.   MRN: [id number]   This visit occurred during the SARS-CoV-2 public health emergency.  Safety protocols were in place, including screening questions prior to the visit, additional usage of staff PPE, and extensive cleaning of exam room while observing appropriate contact time as indicated for disinfecting solutions.    HPI She is here for a physical exam.   For the past 6 months - gets a coated tongue.  She did ask her dentist about it, but she was not concerned about it and just advised her to pressure tongue.  She denies any new medications and has not taken any recent antibiotics or steroids.  She denies reflux.  She does brush her teeth on a regular basis.   Right mid chest pain. Mammo was normal.  No pain with movement or deep breaths. Chest wall is tender.       Medications and allergies reviewed with patient and updated if appropriate.  Patient Active Problem List   Diagnosis Date Noted   Aortic atherosclerosis ([place]) 06/27/2020   Lung nodules -stable, benign, no f/u needed 07/15/2019   History of basal cell carcinoma ([facility]) 06/23/2019   Thyroid antibody positive 11/10/2016   Osteoporosis 08/16/2015   History of colonic polyps 07/04/2012   HYPERCHOLESTEROLEMIA 05/06/2008    Current Outpatient Medications on File Prior to Visit  Medication Sig Dispense Refill   calcium gluconate 500 MG tablet Take 500 mg by mouth daily.     Cholecalciferol (VITAMIN D) 1000 UNITS capsule Take 1,000 Units by mouth daily.     denosumab (PROLIA) 60 MG/ML SOLN injection Inject 60 mg into the skin every 6 (six) months. Administer in upper arm, thigh, or abdomen     glucosamine-chondroitin 500-400 MG tablet Take 1 tablet by mouth daily.     magnesium 30 MG tablet Take 30 mg by mouth 2 (two) times daily.     Multiple Vitamin (MULTIVITAMIN) capsule Take 1 capsule by mouth daily.     Omega-3 Fatty Acids (FISH OIL) 1000  MG CAPS Take 1,000 mg by mouth daily.     POTASSIUM PO Take by mouth.     Turmeric 500 MG CAPS Take 500 mg by mouth daily.      No current facility-administered medications on file prior to visit.    Past Medical History:  Diagnosis Date   Cancer ([place])    basal cell back   Colon polyp 2017   Osteoporosis 10/2017   T score -2.9    Past Surgical History:  Procedure Laterality Date   COLONOSCOPY     COLONOSCOPY WITH PROPOFOL N/A 06/17/2016   Procedure: COLONOSCOPY WITH PROPOFOL;  Surgeon: Gatha Mayer, MD;  Location: [facility];  Service: Endoscopy;  Laterality: N/A;   COLONOSCOPY WITH PROPOFOL N/A 11/03/2017   Procedure: COLONOSCOPY WITH PROPOFOL;  Surgeon: Gatha Mayer, MD;  Location: [facility];  Service: Endoscopy;  Laterality: N/A;   HOT HEMOSTASIS N/A 06/17/2016   Procedure: HOT HEMOSTASIS (ARGON PLASMA COAGULATION/BICAP);  Surgeon: Gatha Mayer, MD;  Location: [facility];  Service: Endoscopy;  Laterality: N/A;   HOT HEMOSTASIS N/A 11/03/2017   Procedure: HOT HEMOSTASIS (ARGON PLASMA COAGULATION/BICAP);  Surgeon: Gatha Mayer, MD;  Location: [facility];  Service: Endoscopy;  Laterality: N/A;   POLYPECTOMY      Social History   Socioeconomic History   Marital status: Married    Spouse name: Not on file  Number of children: Not on file   Years of education: Not on file   Highest education level: Not on file  Occupational History   Occupation: retired  Tobacco Use   Smoking status: Never   Smokeless tobacco: Never  Vaping Use   Vaping Use: Never used  Substance and Sexual Activity   Alcohol use: Yes    Alcohol/week: 0.0 standard drinks    Comment: wine with dinner   Drug use: No   Sexual activity: Yes    Birth control/protection: Post-menopausal    Comment: 1st intercourse 70 yo-Fewer than 5 partners  Other Topics Concern   Not on file  Social History Narrative   Exercises regularly   Social Determinants of Health   Financial Resource Strain:  Not on file  Food Insecurity: Not on file  Transportation Needs: Not on file  Physical Activity: Not on file  Stress: Not on file  Social Connections: Not on file    Family History  Problem Relation Age of Onset   Hypothyroidism Mother    Thyroid disease Mother    Lung cancer Father        smoker   Thyroid disease Sister    Thyroid disease Sister    Thyroid disease Sister    Kidney Stones Sister    Colon cancer Neg Hx    Colon polyps Neg Hx    Esophageal cancer Neg Hx    Rectal cancer Neg Hx    Stomach cancer Neg Hx     Review of Systems  Constitutional:  Negative for chills and fever.  Eyes:  Negative for visual disturbance.  Respiratory:  Negative for cough, shortness of breath and wheezing.   Cardiovascular:  Negative for chest pain, palpitations and leg swelling.  Gastrointestinal:  Negative for abdominal pain, blood in stool, constipation, diarrhea and nausea.       No gerd  Genitourinary:  Negative for dysuria.  Musculoskeletal:  Negative for arthralgias and back pain.  Skin:  Negative for rash.  Neurological:  Negative for light-headedness and headaches.  Psychiatric/Behavioral:  Negative for dysphoric mood. The patient is not nervous/anxious.       Objective:   Vitals:   07/18/21 0822  BP: 120/78  Pulse: (!) 44  Temp: 97.9 F (36.6 C)  SpO2: 98%   Filed Weights   07/18/21 0822  Weight: 134 lb (60.8 kg)   Body mass index is 20.99 kg/m.  BP Readings from Last 3 Encounters:  07/18/21 120/78  05/22/21 (!) 116/55  01/15/21 120/74    Wt Readings from Last 3 Encounters:  07/18/21 134 lb (60.8 kg)  05/22/21 136 lb (61.7 kg)  05/08/21 138 lb (62.6 kg)    Depression screen [facility] 2/9 07/18/2021 06/27/2020 06/23/2019 10/30/2017 04/14/2017  Decreased Interest 0 0 0 0 0  Down, Depressed, Hopeless 0 0 0 0 0  PHQ - 2 Score 0 0 0 0 0  Altered sleeping 0 - - 0 -  Tired, decreased energy 0 - - 0 -  Change in appetite 0 - - 0 -  Feeling bad or failure about  yourself  0 - - 0 -  Trouble concentrating 0 - - 0 -  Moving slowly or fidgety/restless 0 - - 0 -  Suicidal thoughts 0 - - 0 -  PHQ-9 Score 0 - - 0 -  Difficult doing work/chores - - - Not difficult at all -    GAD 7 : Generalized Anxiety Score 07/18/2021  Nervous, Anxious, on Edge  0  Control/stop worrying 0  Worry too much - different things 0  Trouble relaxing 0  Restless 0  Easily annoyed or irritable 0  Afraid - awful might happen 0  Total GAD 7 Score 0        Physical Exam Constitutional: She appears well-developed and well-nourished. No distress.  HENT:  Head: Normocephalic and atraumatic.  Right Ear: External ear normal. Normal ear canal and TM Left Ear: External ear normal.  Normal ear canal and TM Mouth/Throat: Oropharynx is clear and moist.  No concerning tongue coating-slight coating Eyes: Conjunctivae and EOM are normal.  Neck: Neck supple. No tracheal deviation present. No thyromegaly present.  No carotid bruit  Cardiovascular: Normal rate, regular rhythm and normal heart sounds.   No murmur heard.  No edema. Pulmonary/Chest: Effort normal and breath sounds normal. No respiratory distress. She has no wheezes. She has no rales.  Breast: deferred   Abdominal: Soft. She exhibits no distension. There is no tenderness.  Lymphadenopathy: She has no cervical adenopathy.  Skin: Skin is warm and dry. She is not diaphoretic.  Psychiatric: She has a normal mood and affect. Her behavior is normal.     Lab Results  Component Value Date   WBC 5.3 07/18/2021   HGB 14.2 07/18/2021   HCT 43.1 07/18/2021   PLT 310.0 07/18/2021   GLUCOSE 94 07/18/2021   CHOL 200 07/18/2021   TRIG 55.0 07/18/2021   HDL 79.00 07/18/2021   LDLDIRECT 148.0 07/03/2012   LDLCALC 110 (H) 07/18/2021   ALT 13 07/18/2021   AST 22 07/18/2021   NA 139 07/18/2021   K 4.9 07/18/2021   CL 103 07/18/2021   CREATININE 0.86 07/18/2021   BUN 13 07/18/2021   CO2 29 07/18/2021   TSH 2.92 07/18/2021          Assessment & Plan:   Physical exam: Screening blood work  ordered Exercise  regular Weight  normal Substance abuse  none Sees derm annually    Screened for depression using the PHQ 9 scale.  No evidence of depression.   Screened for anxiety using GAD7 Scale.  No evidence of anxiety.   Reviewed recommended immunizations.   Health Maintenance  Topic Date Due   COVID-19 Vaccine (3 - Pfizer risk series) 08/03/2021 (Originally 12/02/2019)   INFLUENZA VACCINE  12/07/2021 (Originally 04/09/2021)   TETANUS/TDAP  07/18/2022 (Originally 01/28/2021)   DEXA SCAN  10/19/2021   MAMMOGRAM  03/06/2023   COLONOSCOPY (Pts 45-58yrs Insurance coverage will need to be confirmed)  05/23/2031   Pneumonia Vaccine 47+ Years old  Completed   Hepatitis C Screening  Completed   Zoster Vaccines- Shingrix  Completed   HPV VACCINES  Aged Out          See Problem List for Assessment and Plan of chronic medical problems.

## 2021-07-17 NOTE — Patient Instructions (Addendum)
Blood work was ordered.     Medications changes include : none      Please followup in 1 year   Health Maintenance, Female Adopting a healthy lifestyle and getting preventive care are important in promoting health and wellness. Ask your health care provider about: The right schedule for you to have regular tests and exams. Things you can do on your own to prevent diseases and keep yourself healthy. What should I know about diet, weight, and exercise? Eat a healthy diet  Eat a diet that includes plenty of vegetables, fruits, low-fat dairy products, and lean protein. Do not eat a lot of foods that are high in solid fats, added sugars, or sodium. Maintain a healthy weight Body mass index (BMI) is used to identify weight problems. It estimates body fat based on height and weight. Your health care provider can help determine your BMI and help you achieve or maintain a healthy weight. Get regular exercise Get regular exercise. This is one of the most important things you can do for your health. Most adults should: Exercise for at least 150 minutes each week. The exercise should increase your heart rate and make you sweat (moderate-intensity exercise). Do strengthening exercises at least twice a week. This is in addition to the moderate-intensity exercise. Spend less time sitting. Even light physical activity can be beneficial. Watch cholesterol and blood lipids Have your blood tested for lipids and cholesterol at 71 years of age, then have this test every 5 years. Have your cholesterol levels checked more often if: Your lipid or cholesterol levels are high. You are older than 71 years of age. You are at high risk for heart disease. What should I know about cancer screening? Depending on your health history and family history, you may need to have cancer screening at various ages. This may include screening for: Breast cancer. Cervical cancer. Colorectal cancer. Skin cancer. Lung  cancer. What should I know about heart disease, diabetes, and high blood pressure? Blood pressure and heart disease High blood pressure causes heart disease and increases the risk of stroke. This is more likely to develop in people who have high blood pressure readings or are overweight. Have your blood pressure checked: Every 3-5 years if you are 89-39 years of age. Every year if you are 35 years old or older. Diabetes Have regular diabetes screenings. This checks your fasting blood sugar level. Have the screening done: Once every three years after age 13 if you are at a normal weight and have a low risk for diabetes. More often and at a younger age if you are overweight or have a high risk for diabetes. What should I know about preventing infection? Hepatitis B If you have a higher risk for hepatitis B, you should be screened for this virus. Talk with your health care provider to find out if you are at risk for hepatitis B infection. Hepatitis C Testing is recommended for: Everyone born from [birth year] through 1966. Anyone with known risk factors for hepatitis C. Sexually transmitted infections (STIs) Get screened for STIs, including gonorrhea and chlamydia, if: You are sexually active and are younger than 71 years of age. You are older than 71 years of age and your health care provider tells you that you are at risk for this type of infection. Your sexual activity has changed since you were last screened, and you are at increased risk for chlamydia or gonorrhea. Ask your health care provider if you are at risk. Ask your  health care provider about whether you are at high risk for HIV. Your health care provider may recommend a prescription medicine to help prevent HIV infection. If you choose to take medicine to prevent HIV, you should first get tested for HIV. You should then be tested every 3 months for as long as you are taking the medicine. Pregnancy If you are about to stop having your  period (premenopausal) and you may become pregnant, seek counseling before you get pregnant. Take 400 to 800 micrograms (mcg) of folic acid every day if you become pregnant. Ask for birth control (contraception) if you want to prevent pregnancy. Osteoporosis and menopause Osteoporosis is a disease in which the bones lose minerals and strength with aging. This can result in bone fractures. If you are 7 years old or older, or if you are at risk for osteoporosis and fractures, ask your health care provider if you should: Be screened for bone loss. Take a calcium or vitamin D supplement to lower your risk of fractures. Be given hormone replacement therapy (HRT) to treat symptoms of menopause. Follow these instructions at home: Alcohol use Do not drink alcohol if: Your health care provider tells you not to drink. You are pregnant, may be pregnant, or are planning to become pregnant. If you drink alcohol: Limit how much you have to: 0-1 drink a day. Know how much alcohol is in your drink. In the [place] one drink equals one 12 oz bottle of beer (355 mL), one 5 oz glass of wine (148 mL), or one 1 oz glass of hard liquor (44 mL). Lifestyle Do not use any products that contain nicotine or tobacco. These products include cigarettes, chewing tobacco, and vaping devices, such as e-cigarettes. If you need help quitting, ask your health care provider. Do not use street drugs. Do not share needles. Ask your health care provider for help if you need support or information about quitting drugs. General instructions Schedule regular health, dental, and eye exams. Stay current with your vaccines. Tell your health care provider if: You often feel depressed. You have ever been abused or do not feel safe at home. Summary Adopting a healthy lifestyle and getting preventive care are important in promoting health and wellness. Follow your health care provider's instructions about healthy diet, exercising, and  getting tested or screened for diseases. Follow your health care provider's instructions on monitoring your cholesterol and blood pressure. This information is not intended to replace advice given to you by your health care provider. Make sure you discuss any questions you have with your health care provider. Document Revised: 01/15/2021 Document Reviewed: 01/15/2021 Elsevier Patient Education  [place].

## 2021-07-18 ENCOUNTER — Ambulatory Visit (INDEPENDENT_AMBULATORY_CARE_PROVIDER_SITE_OTHER): Payer: Medicare PPO | Admitting: Internal Medicine

## 2021-07-18 ENCOUNTER — Other Ambulatory Visit: Payer: Self-pay

## 2021-07-18 ENCOUNTER — Encounter: Payer: Self-pay | Admitting: Internal Medicine

## 2021-07-18 VITALS — BP 120/78 | HR 44 | Temp 97.9°F | Ht 67.0 in | Wt 134.0 lb

## 2021-07-18 DIAGNOSIS — Z0000 Encounter for general adult medical examination without abnormal findings: Secondary | ICD-10-CM | POA: Diagnosis not present

## 2021-07-18 DIAGNOSIS — Z1331 Encounter for screening for depression: Secondary | ICD-10-CM

## 2021-07-18 DIAGNOSIS — I700 Atherosclerosis of aorta: Secondary | ICD-10-CM

## 2021-07-18 DIAGNOSIS — R768 Other specified abnormal immunological findings in serum: Secondary | ICD-10-CM

## 2021-07-18 DIAGNOSIS — E7800 Pure hypercholesterolemia, unspecified: Secondary | ICD-10-CM | POA: Diagnosis not present

## 2021-07-18 DIAGNOSIS — Z85828 Personal history of other malignant neoplasm of skin: Secondary | ICD-10-CM | POA: Diagnosis not present

## 2021-07-18 DIAGNOSIS — M810 Age-related osteoporosis without current pathological fracture: Secondary | ICD-10-CM

## 2021-07-18 LAB — CBC WITH DIFFERENTIAL/PLATELET
Basophils Absolute: 0 10*3/uL (ref 0.0–0.1)
Basophils Relative: 0.7 % (ref 0.0–3.0)
Eosinophils Absolute: 0.1 10*3/uL (ref 0.0–0.7)
Eosinophils Relative: 1.7 % (ref 0.0–5.0)
HCT: 43.1 % (ref 36.0–46.0)
Hemoglobin: 14.2 g/dL (ref 12.0–15.0)
Lymphocytes Relative: 30.2 % (ref 12.0–46.0)
Lymphs Abs: 1.6 10*3/uL (ref 0.7–4.0)
MCHC: 32.9 g/dL (ref 30.0–36.0)
MCV: 98.8 fl (ref 78.0–100.0)
Monocytes Absolute: 0.5 10*3/uL (ref 0.1–1.0)
Monocytes Relative: 9.1 % (ref 3.0–12.0)
Neutro Abs: 3.1 10*3/uL (ref 1.4–7.7)
Neutrophils Relative %: 58.3 % (ref 43.0–77.0)
Platelets: 310 10*3/uL (ref 150.0–400.0)
RBC: 4.36 Mil/uL (ref 3.87–5.11)
RDW: 13.8 % (ref 11.5–15.5)
WBC: 5.3 10*3/uL (ref 4.0–10.5)

## 2021-07-18 LAB — T4, FREE: Free T4: 0.85 ng/dL (ref 0.60–1.60)

## 2021-07-18 LAB — COMPREHENSIVE METABOLIC PANEL
ALT: 13 U/L (ref 0–35)
AST: 22 U/L (ref 0–37)
Albumin: 4.6 g/dL (ref 3.5–5.2)
Alkaline Phosphatase: 46 U/L (ref 39–117)
BUN: 13 mg/dL (ref 6–23)
CO2: 29 mEq/L (ref 19–32)
Calcium: 9.5 mg/dL (ref 8.4–10.5)
Chloride: 103 mEq/L (ref 96–112)
Creatinine, Ser: 0.86 mg/dL (ref 0.40–1.20)
GFR: 67.8 mL/min (ref >60.00)
Glucose, Bld: 94 mg/dL (ref 70–99)
Potassium: 4.9 mEq/L (ref 3.5–5.1)
Sodium: 139 mEq/L (ref 135–145)
Total Bilirubin: 0.7 mg/dL (ref 0.2–1.2)
Total Protein: 7.8 g/dL (ref 6.0–8.3)

## 2021-07-18 LAB — LIPID PANEL
Cholesterol: 200 mg/dL (ref 0–200)
HDL: 79 mg/dL (ref >39.00)
LDL Cholesterol: 110 mg/dL — ABNORMAL HIGH (ref 0–99)
NonHDL: 121.19
Total CHOL/HDL Ratio: 3
Triglycerides: 55 mg/dL (ref 0.0–149.0)
VLDL: 11 mg/dL (ref 0.0–40.0)

## 2021-07-18 LAB — TSH: TSH: 2.92 u[IU]/mL (ref 0.35–5.50)

## 2021-07-18 NOTE — Assessment & Plan Note (Signed)
Chronic Doing prolia via gyn dexa via gyn Continue regular exercise continue calcium and vitamin d daily

## 2021-07-18 NOTE — Assessment & Plan Note (Addendum)
Chronic  Clinically euthyroid Check tsh, free T4  Discussed that as long as her thyroid functions in normal range no treatment is necessary

## 2021-07-18 NOTE — Assessment & Plan Note (Signed)
Chronic Mild Lab Results  Component Value Date   LDLCALC 138 (H) 06/28/2020   LDL above goal but low risk Discussed low dose statin, coronary artery calcium test Regular exercise, low cholesterol/fat diet discussed Lipids today

## 2021-07-18 NOTE — Assessment & Plan Note (Signed)
Chronic Sees dermatology regularly

## 2021-07-18 NOTE — Assessment & Plan Note (Signed)
Chronic Check lipid panel Diet controlled Regular exercise and healthy diet encouraged

## 2021-08-14 ENCOUNTER — Telehealth: Payer: Self-pay | Admitting: Internal Medicine

## 2021-08-14 NOTE — Telephone Encounter (Signed)
Left message for patient to call me back at [phone] to schedule Medicare Annual Wellness Visit   Last AWV  06/27/20  Please schedule at anytime with [facility] if patient calls the office back.    40 Minutes appointment   Any questions, please call me at [phone]

## 2021-08-22 ENCOUNTER — Ambulatory Visit (INDEPENDENT_AMBULATORY_CARE_PROVIDER_SITE_OTHER): Payer: Medicare PPO

## 2021-08-22 ENCOUNTER — Other Ambulatory Visit: Payer: Self-pay

## 2021-08-22 DIAGNOSIS — Z0000 Encounter for general adult medical examination without abnormal findings: Secondary | ICD-10-CM | POA: Diagnosis not present

## 2021-08-22 NOTE — Progress Notes (Signed)
I connected with Catherine [name] today by telephone and verified that I am speaking with the correct person using two identifiers. Location patient: home Location provider: work Persons participating in the virtual visit: patient, provider.   I discussed the limitations, risks, security and privacy concerns of performing an evaluation and management service by telephone and the availability of in person appointments. I also discussed with the patient that there may be a patient responsible charge related to this service. The patient expressed understanding and verbally consented to this telephonic visit.    Interactive audio and video telecommunications were attempted between this provider and patient, however failed, due to patient having technical difficulties OR patient did not have access to video capability.  We continued and completed visit with audio only.  Some vital signs may be absent or patient reported.   Time Spent with patient on telephone encounter: 40 minutes  Subjective:   Catherine [name] is a 70 y.o. female who presents for Medicare Annual (Subsequent) preventive examination.  Review of Systems     Cardiac Risk Factors include: advanced age (>55men, >60 women)     Objective:    There were no vitals filed for this visit. There is no height or weight on file to calculate BMI.  Advanced Directives 08/22/2021 06/27/2020 11/29/2018 11/03/2017 10/30/2017 06/17/2016 06/11/2016  Does Patient Have a Medical Advance Directive? Yes Yes Yes Yes Yes Yes Yes  Type of Advance Directive Living will [place];Living will [place];Living will [place];Living will [place];Living will [place];Living will [place];Living will  Does patient want to make changes to medical advance directive? No - Patient declined No - Patient declined - - - - No - Patient declined  Copy of  [place] in Chart? - No - copy requested No - copy requested No - copy requested No - copy requested - No - copy requested  Would patient like information on creating a medical advance directive? - - No - Patient declined - - - -    Current Medications (verified) Outpatient Encounter Medications as of 08/22/2021  Medication Sig   calcium gluconate 500 MG tablet Take 500 mg by mouth daily.   Cholecalciferol (VITAMIN D) 1000 UNITS capsule Take 1,000 Units by mouth daily.   denosumab (PROLIA) 60 MG/ML SOLN injection Inject 60 mg into the skin every 6 (six) months. Administer in upper arm, thigh, or abdomen   glucosamine-chondroitin 500-400 MG tablet Take 1 tablet by mouth daily.   magnesium 30 MG tablet Take 30 mg by mouth 2 (two) times daily.   Multiple Vitamin (MULTIVITAMIN) capsule Take 1 capsule by mouth daily.   Omega-3 Fatty Acids (FISH OIL) 1000 MG CAPS Take 1,000 mg by mouth daily.   POTASSIUM PO Take by mouth.   Turmeric 500 MG CAPS Take 500 mg by mouth daily.    No facility-administered encounter medications on file as of 08/22/2021.    Allergies (verified) Oysters [shellfish allergy], Avocado, and Latex   History: Past Medical History:  Diagnosis Date   Cancer ([place])    basal cell back   Colon polyp 2017   Osteoporosis 10/2017   T score -2.9   Past Surgical History:  Procedure Laterality Date   COLONOSCOPY     COLONOSCOPY WITH PROPOFOL N/A 06/17/2016   Procedure: COLONOSCOPY WITH PROPOFOL;  Surgeon: Gatha Mayer, MD;  Location: [facility];  Service: Endoscopy;  Laterality: N/A;  COLONOSCOPY WITH PROPOFOL N/A 11/03/2017   Procedure: COLONOSCOPY WITH PROPOFOL;  Surgeon: Gatha Mayer, MD;  Location: [facility];  Service: Endoscopy;  Laterality: N/A;   HOT HEMOSTASIS N/A 06/17/2016   Procedure: HOT HEMOSTASIS (ARGON PLASMA COAGULATION/BICAP);  Surgeon: Gatha Mayer, MD;  Location: [facility];  Service: Endoscopy;  Laterality: N/A;   HOT  HEMOSTASIS N/A 11/03/2017   Procedure: HOT HEMOSTASIS (ARGON PLASMA COAGULATION/BICAP);  Surgeon: Gatha Mayer, MD;  Location: [facility];  Service: Endoscopy;  Laterality: N/A;   POLYPECTOMY     Family History  Problem Relation Age of Onset   Hypothyroidism Mother    Thyroid disease Mother    Lung cancer Father        smoker   Thyroid disease Sister    Thyroid disease Sister    Thyroid disease Sister    Kidney Stones Sister    Colon cancer Neg Hx    Colon polyps Neg Hx    Esophageal cancer Neg Hx    Rectal cancer Neg Hx    Stomach cancer Neg Hx    Social History   Socioeconomic History   Marital status: Married    Spouse name: Not on file   Number of children: Not on file   Years of education: Not on file   Highest education level: Not on file  Occupational History   Occupation: retired  Tobacco Use   Smoking status: Never   Smokeless tobacco: Never  Vaping Use   Vaping Use: Never used  Substance and Sexual Activity   Alcohol use: Yes    Alcohol/week: 0.0 standard drinks    Comment: wine with dinner   Drug use: No   Sexual activity: Yes    Birth control/protection: Post-menopausal    Comment: 1st intercourse 70 yo-Fewer than 5 partners  Other Topics Concern   Not on file  Social History Narrative   Exercises regularly   Social Determinants of Health   Financial Resource Strain: Not on file  Food Insecurity: No Food Insecurity   Worried About Charity fundraiser in the Last Year: Never true   Ran Out of Food in the Last Year: Never true  Transportation Needs: No Transportation Needs   Lack of Transportation (Medical): No   Lack of Transportation (Non-Medical): No  Physical Activity: Sufficiently Active   Days of Exercise per Week: 5 days   Minutes of Exercise per Session: 30 min  Stress: No Stress Concern Present   Feeling of Stress : Not at all  Social Connections: Socially Integrated   Frequency of Communication with Friends and Family: More than  three times a week   Frequency of Social Gatherings with Friends and Family: More than three times a week   Attends Religious Services: More than 4 times per year   Active Member of [employer] or Organizations: Yes   Attends Music therapist: More than 4 times per year   Marital Status: Married    Tobacco Counseling Counseling given: Not Answered   Clinical Intake:  Pre-visit preparation completed: Yes  Pain : No/denies pain     Nutritional Risks: None Diabetes: No  How often do you need to have someone help you when you read instructions, pamphlets, or other written materials from your doctor or pharmacy?: 1 - Never What is the last grade level you completed in school?: Doctorate Degree  Diabetic? no  Interpreter Needed?: No  Information entered by :: Lisette Abu, PN   Activities of Daily Living  In your present state of health, do you have any difficulty performing the following activities: 08/22/2021 07/18/2021  Hearing? N N  Vision? N N  Difficulty concentrating or making decisions? N N  Walking or climbing stairs? N N  Dressing or bathing? N N  Doing errands, shopping? N N  Preparing Food and eating ? N -  Using the Toilet? N -  In the past six months, have you accidently leaked urine? N -  Do you have problems with loss of bowel control? N -  Managing your Medications? N -  Managing your Finances? N -  Housekeeping or managing your Housekeeping? N -  Some recent data might be hidden    Patient Care Team: Binnie Rail, MD as PCP - General (Internal Medicine) Ralene Bathe, MD as Consulting Physician (Ophthalmology) Gatha Mayer, MD as Consulting Physician (Gastroenterology)  Indicate any recent Medical Services you may have received from other than [facility] providers in the past year (date may be approximate).     Assessment:   This is a routine wellness examination for Catherine [name].  Hearing/Vision screen Hearing Screening - Comments::  Patient denied any hearing difficulty.   No hearing aids.  Vision Screening - Comments:: Patient wears corrective glasses/contacts.  Eye exam done annually by: Jola Schmidt, MD  Dietary issues and exercise activities discussed: Current Exercise Habits: Home exercise routine, Type of exercise: walking, Time (Minutes): 30, Frequency (Times/Week): 5, Weekly Exercise (Minutes/Week): 150, Intensity: Moderate, Exercise limited by: None identified   Goals Addressed   None   Depression Screen PHQ 2/9 Scores 08/22/2021 07/18/2021 06/27/2020 06/23/2019 10/30/2017 04/14/2017 08/28/2016  PHQ - 2 Score 0 0 0 0 0 0 0  PHQ- 9 Score - 0 - - 0 - -    Fall Risk Fall Risk  08/22/2021 07/18/2021 06/27/2020 06/23/2019 10/30/2017  Falls in the past year? 0 0 0 0 No  Number falls in past yr: 0 0 0 0 -  Injury with Fall? 0 0 0 - -  Risk for fall due to : No Fall Risks No Fall Risks No Fall Risks - -  Follow up Falls prevention discussed Falls evaluation completed Falls evaluation completed - -    FALL RISK PREVENTION PERTAINING TO THE HOME:  Any stairs in or around the home? Yes  If so, are there any without handrails? No  Home free of loose throw rugs in walkways, pet beds, electrical cords, etc? Yes  Adequate lighting in your home to reduce risk of falls? Yes   ASSISTIVE DEVICES UTILIZED TO PREVENT FALLS:  Life alert? No  Use of a cane, walker or w/c? No  Grab bars in the bathroom? Yes  Shower chair or bench in shower? No  Elevated toilet seat or a handicapped toilet? Yes   TIMED UP AND GO:  Was the test performed? No .  Length of time to ambulate 10 feet: n/a sec.   Gait steady and fast without use of assistive device  Cognitive Function: Normal cognitive status assessed by direct observation by this Nurse Health Advisor. No abnormalities found.   MMSE - Mini Mental State Exam 10/30/2017  Not completed: Refused        Immunizations Immunization History  Administered Date(s) Administered    Fluad Quad(high Dose 65+) 06/23/2019   Hepatitis A 04/23/2006, 11/07/2006   Hepatitis B 04/21/2013, 05/25/2013, 08/19/2013   Influenza Split 07/03/2012, 06/11/2013   Influenza Whole 06/30/2017   Influenza, High Dose Seasonal PF 05/30/2016, 06/30/2018   Influenza-Unspecified  06/16/2014, 06/19/2015, 05/29/2020, 06/19/2021   MenQuadfi_Meningococcal Groups ACYW Conjugate 08/21/2021   PFIZER(Purple Top)SARS-COV-2 Vaccination 10/14/2019, 11/04/2019, 06/29/2020, 01/05/2021   PPD Test 05/28/2016   Pfizer Covid-19 Vaccine Bivalent Booster 35yrs & up 06/15/2021   Pneumococcal Conjugate-13 08/16/2015   Pneumococcal Polysaccharide-23 10/03/2016   Td 05/29/2005, 08/21/2021   Tdap 08/21/2021   Typhoid Live 08/21/2021   Yellow Fever 08/21/2021   Zoster Recombinat (Shingrix) 01/24/2017, 05/14/2017   Zoster, Live 11/21/2014    TDAP status: Up to date  Flu Vaccine status: Up to date  Pneumococcal vaccine status: Up to date  Covid-19 vaccine status: Completed vaccines  Qualifies for Shingles Vaccine? Yes   Zostavax completed Yes   Shingrix Completed?: Yes  Screening Tests Health Maintenance  Topic Date Due   DEXA SCAN  10/19/2021   MAMMOGRAM  03/06/2023   COLONOSCOPY (Pts 45-48yrs Insurance coverage will need to be confirmed)  05/23/2031   TETANUS/TDAP  08/22/2031   Pneumonia Vaccine 38+ Years old  Completed   INFLUENZA VACCINE  Completed   COVID-19 Vaccine  Completed   Hepatitis C Screening  Completed   Zoster Vaccines- Shingrix  Completed   HPV VACCINES  Aged Out    Health Maintenance  There are no preventive care reminders to display for this patient.  Colorectal cancer screening: No longer required.  Completed: 05/22/2021  Mammogram status: Completed 03/05/2021. Repeat every year  Bone Density status: Completed 10/22/2019. Results reflect: Bone density results: OSTEOPOROSIS. Repeat every 2 years.  Lung Cancer Screening: (Low Dose CT Chest recommended if Age 53-80 years, 30  pack-year currently smoking OR have quit w/in 15years.) does not qualify.   Lung Cancer Screening Referral: no  Additional Screening:  Hepatitis C Screening: does qualify; Completed yes  Vision Screening: Recommended annual ophthalmology exams for early detection of glaucoma and other disorders of the eye. Is the patient up to date with their annual eye exam?  Yes  Who is the provider or what is the name of the office in which the patient attends annual eye exams? Jola Schmidt, MD. If pt is not established with a provider, would they like to be referred to a provider to establish care? No .   Dental Screening: Recommended annual dental exams for proper oral hygiene  Community Resource Referral / Chronic Care Management: CRR required this visit?  No   CCM required this visit?  No      [name]:     I have personally reviewed and noted the following in the patients chart:   Medical and social history Use of alcohol, tobacco or illicit drugs  Current medications and supplements including opioid prescriptions.  Functional ability and status Nutritional status Physical activity Advanced directives List of other physicians Hospitalizations, surgeries, and ER visits in previous 12 months Vitals Screenings to include cognitive, depression, and falls Referrals and appointments  In addition, I have reviewed and discussed with patient certain preventive protocols, quality metrics, and best practice recommendations. A written personalized care [name] for preventive services as well as general preventive health recommendations were provided to patient.     Sheral Flow, LPN   13/24/4010   Nurse Notes:  Patient is cogitatively intact. There were no vitals filed for this visit. There is no height or weight on file to calculate BMI. Patient stated that she has no issues with gait or balance; does not use any assistive devices. Medications reviewed with patient; no opioid use  noted. Hearing Screening - Comments:: Patient denied any hearing difficulty.  No hearing aids.  Vision Screening - Comments:: Patient wears corrective glasses/contacts.  Eye exam done annually by: Jola Schmidt, MD

## 2021-08-29 ENCOUNTER — Ambulatory Visit (INDEPENDENT_AMBULATORY_CARE_PROVIDER_SITE_OTHER): Payer: Medicare PPO

## 2021-08-29 ENCOUNTER — Other Ambulatory Visit: Payer: Self-pay

## 2021-08-29 DIAGNOSIS — M810 Age-related osteoporosis without current pathological fracture: Secondary | ICD-10-CM

## 2021-08-29 MED ORDER — DENOSUMAB 60 MG/ML ~~LOC~~ SOSY
60.0000 mg | PREFILLED_SYRINGE | Freq: Once | SUBCUTANEOUS | Status: AC
Start: 2021-08-29 — End: 2021-08-29
  Administered 2021-08-29: 10:00:00 60 mg via SUBCUTANEOUS

## 2021-08-29 NOTE — Telephone Encounter (Signed)
Patient received prolia injection on 08-29-21. Summary of benefits scanned into Epic.   Encounter closed.

## 2021-11-13 DIAGNOSIS — H2513 Age-related nuclear cataract, bilateral: Secondary | ICD-10-CM | POA: Diagnosis not present

## 2021-11-13 DIAGNOSIS — H524 Presbyopia: Secondary | ICD-10-CM | POA: Diagnosis not present

## 2022-01-28 ENCOUNTER — Ambulatory Visit: Payer: Medicare PPO | Admitting: Obstetrics & Gynecology

## 2022-01-31 ENCOUNTER — Ambulatory Visit (INDEPENDENT_AMBULATORY_CARE_PROVIDER_SITE_OTHER): Payer: Medicare PPO | Admitting: Obstetrics & Gynecology

## 2022-01-31 ENCOUNTER — Encounter: Payer: Self-pay | Admitting: Obstetrics & Gynecology

## 2022-01-31 VITALS — BP 118/68 | HR 64 | Resp 16 | Ht 66.75 in | Wt 137.0 lb

## 2022-01-31 DIAGNOSIS — Z780 Asymptomatic menopausal state: Secondary | ICD-10-CM

## 2022-01-31 DIAGNOSIS — M810 Age-related osteoporosis without current pathological fracture: Secondary | ICD-10-CM

## 2022-01-31 DIAGNOSIS — Z01419 Encounter for gynecological examination (general) (routine) without abnormal findings: Secondary | ICD-10-CM

## 2022-01-31 NOTE — Progress Notes (Signed)
Catherine [name] Feb 02, 1951 [id number]   History:    71 y.o. G0 Married  RP:  Established patient presenting for annual gyn exam   HPI: Postmenopause, well on no HRT.  No PMB.  No pelvic pain.  No h/o abnormal Pap.  Pap 01/2021 Neg.  Would like Paps every 2 years.  Breasts normal currently. Had Rt breast pain last year.  Last mammo Neg in 02/2021.  Rt Dx mammo for breast pain was Neg as well in 02/2021.  Will schedule mammo now.  BD 10/2019 showed improvement on Prolia.  Will schedule BD at [facility] now.  Colono 05/2021.  BMI 21.62.  Questions on wine consumption. Health labs with Fam MD.  Past medical history,surgical history, family history and social history were all reviewed and documented in the EPIC chart.  Gynecologic History Patient's last menstrual period was 09/09/1996.  Obstetric History OB History  Gravida Para Term Preterm AB Living  0            SAB IAB Ectopic Multiple Live Births                ROS: A ROS was performed and pertinent positives and negatives are included in the history.  GENERAL: No fevers or chills. HEENT: No change in vision, no earache, sore throat or sinus congestion. NECK: No pain or stiffness. CARDIOVASCULAR: No chest pain or pressure. No palpitations. PULMONARY: No shortness of breath, cough or wheeze. GASTROINTESTINAL: No abdominal pain, nausea, vomiting or diarrhea, melena or bright red blood per rectum. GENITOURINARY: No urinary frequency, urgency, hesitancy or dysuria. MUSCULOSKELETAL: No joint or muscle pain, no back pain, no recent trauma. DERMATOLOGIC: No rash, no itching, no lesions. ENDOCRINE: No polyuria, polydipsia, no heat or cold intolerance. No recent change in weight. HEMATOLOGICAL: No anemia or easy bruising or bleeding. NEUROLOGIC: No headache, seizures, numbness, tingling or weakness. PSYCHIATRIC: No depression, no loss of interest in normal activity or change in sleep pattern.     Exam:   BP 118/68   Pulse 64   Resp 16   Ht 5' 6.75"  (1.695 m)   Wt 137 lb (62.1 kg)   LMP 09/09/1996   BMI 21.62 kg/m   Body mass index is 21.62 kg/m.  General appearance : Well developed well nourished female. No acute distress HEENT: Eyes: no retinal hemorrhage or exudates,  Neck supple, trachea midline, no carotid bruits, no thyroidmegaly Lungs: Clear to auscultation, no rhonchi or wheezes, or rib retractions  Heart: Regular rate and rhythm, no murmurs or gallops Breast:Examined in sitting and supine position were symmetrical in appearance, no palpable masses or tenderness,  no skin retraction, no nipple inversion, no nipple discharge, no skin discoloration, no axillary or supraclavicular lymphadenopathy Abdomen: no palpable masses or tenderness, no rebound or guarding Extremities: no edema or skin discoloration or tenderness  Pelvic: Vulva: Normal             Vagina: No gross lesions or discharge  Cervix: No gross lesions or discharge  Uterus  AV, normal size, shape and consistency, non-tender and mobile  Adnexa  Without masses or tenderness  Anus: Normal   Assessment/Plan:  71 y.o. female for annual exam   1. Well female exam with routine gynecological exam Postmenopause, well on no HRT.  No PMB.  No pelvic pain.  No h/o abnormal Pap.  Pap 01/2021 Neg.  Would like Paps every 2 years.  Breasts normal currently.  Last mammo Neg in 02/2021.  Rt Dx mammo for  breast pain was Neg as well in 02/2021.  Will schedule mammo now.  BD 10/2019 showed improvement on Prolia.  Will schedule BD at [facility] now.  Colono 05/2021.  BMI 21.62.  Health labs with Fam MD.  2. Postmenopause Postmenopause, well on no HRT.  No PMB.  No pelvic pain.  3. Age-related osteoporosis without current pathological fracture BD 10/2019 showed improvement on Prolia.  Will schedule BD at [facility] now.  Other orders - UNABLE TO FIND; Med Name: fennel   Counseling on wine consumption and it's risks, counseling on breasts symptoms and risks of breast Cancer, counseling on  osteoporosis and it's management for 10 minutes.  Princess Bruins MD, 10:48 AM 01/31/2022

## 2022-02-05 ENCOUNTER — Telehealth: Payer: Self-pay | Admitting: *Deleted

## 2022-02-05 NOTE — Telephone Encounter (Signed)
Patient called requesting dexa order to be faxed to [facility]. Order faxed patient aware she can call and schedule.

## 2022-02-25 ENCOUNTER — Telehealth: Payer: Self-pay | Admitting: *Deleted

## 2022-02-25 NOTE — Telephone Encounter (Signed)
Deductible n/a  OOP MAX n/a  Annual exam 01/31/2022  Calcium  9.5           Date 07/18/2021  Upcoming dental procedures no  Prior Authorization needed yes [id number] 01/24/2020-09/08/2022  Pt estimated Cost $40    03/06/2022   Coverage Details 0% one dose, $40 admin fee

## 2022-03-05 ENCOUNTER — Encounter: Payer: Self-pay | Admitting: Obstetrics & Gynecology

## 2022-03-05 DIAGNOSIS — M8589 Other specified disorders of bone density and structure, multiple sites: Secondary | ICD-10-CM | POA: Diagnosis not present

## 2022-03-05 DIAGNOSIS — Z1231 Encounter for screening mammogram for malignant neoplasm of breast: Secondary | ICD-10-CM | POA: Diagnosis not present

## 2022-03-06 ENCOUNTER — Ambulatory Visit (INDEPENDENT_AMBULATORY_CARE_PROVIDER_SITE_OTHER): Payer: Medicare PPO | Admitting: Gynecology

## 2022-03-06 DIAGNOSIS — M810 Age-related osteoporosis without current pathological fracture: Secondary | ICD-10-CM

## 2022-03-06 MED ORDER — DENOSUMAB 60 MG/ML ~~LOC~~ SOSY
60.0000 mg | PREFILLED_SYRINGE | Freq: Once | SUBCUTANEOUS | Status: AC
  Administered 2022-03-06: 60 mg via SUBCUTANEOUS

## 2022-04-15 ENCOUNTER — Other Ambulatory Visit: Payer: Self-pay | Admitting: Nurse Practitioner

## 2022-05-30 DIAGNOSIS — D485 Neoplasm of uncertain behavior of skin: Secondary | ICD-10-CM | POA: Diagnosis not present

## 2022-05-30 DIAGNOSIS — L814 Other melanin hyperpigmentation: Secondary | ICD-10-CM | POA: Diagnosis not present

## 2022-05-30 DIAGNOSIS — L821 Other seborrheic keratosis: Secondary | ICD-10-CM | POA: Diagnosis not present

## 2022-05-30 DIAGNOSIS — D225 Melanocytic nevi of trunk: Secondary | ICD-10-CM | POA: Diagnosis not present

## 2022-05-30 DIAGNOSIS — L820 Inflamed seborrheic keratosis: Secondary | ICD-10-CM | POA: Diagnosis not present

## 2022-05-30 DIAGNOSIS — N6459 Other signs and symptoms in breast: Secondary | ICD-10-CM | POA: Diagnosis not present

## 2022-05-30 DIAGNOSIS — Z86018 Personal history of other benign neoplasm: Secondary | ICD-10-CM | POA: Diagnosis not present

## 2022-05-30 DIAGNOSIS — D2271 Melanocytic nevi of right lower limb, including hip: Secondary | ICD-10-CM | POA: Diagnosis not present

## 2022-05-30 DIAGNOSIS — D2261 Melanocytic nevi of right upper limb, including shoulder: Secondary | ICD-10-CM | POA: Diagnosis not present

## 2022-05-30 DIAGNOSIS — Z85828 Personal history of other malignant neoplasm of skin: Secondary | ICD-10-CM | POA: Diagnosis not present

## 2022-06-17 ENCOUNTER — Encounter: Payer: Self-pay | Admitting: Obstetrics & Gynecology

## 2022-08-13 ENCOUNTER — Telehealth: Payer: Self-pay | Admitting: Internal Medicine

## 2022-08-13 NOTE — Telephone Encounter (Signed)
N/A unable to leave a message for patient to call back to schedule Medicare Annual Wellness Visit   Last AWV  08/22/21  Please schedule at anytime with [facility] if patient calls the office back.     Any questions, please call me at [phone]

## 2022-08-26 ENCOUNTER — Ambulatory Visit (INDEPENDENT_AMBULATORY_CARE_PROVIDER_SITE_OTHER): Payer: Medicare PPO

## 2022-08-26 ENCOUNTER — Telehealth: Payer: Self-pay

## 2022-08-26 VITALS — Ht 66.75 in | Wt 138.0 lb

## 2022-08-26 DIAGNOSIS — Z0000 Encounter for general adult medical examination without abnormal findings: Secondary | ICD-10-CM | POA: Diagnosis not present

## 2022-08-26 NOTE — Patient Instructions (Signed)
Catherine [name] , Thank you for taking time to come for your Medicare Wellness Visit. I appreciate your ongoing commitment to your health goals. Please review the following plan we discussed and let me know if I can assist you in the future.   These are the goals we discussed:  Goals      patient     My goal for 2024 is to continue to travel, enjoy life, and stay physically active because it motivates me.        This is a list of the screening recommended for you and due dates:  Health Maintenance  Topic Date Due   COVID-19 Vaccine (7 - 2023-24 season) 07/29/2022   Medicare Annual Wellness Visit  08/27/2023   Mammogram  03/05/2024   DEXA scan (bone density measurement)  03/05/2024   Colon Cancer Screening  05/23/2031   DTaP/Tdap/Td vaccine (4 - Td or Tdap) 08/22/2031   Pneumonia Vaccine  Completed   Flu Shot  Completed   Hepatitis C Screening: USPSTF Recommendation to screen - Ages 18-79 yo.  Completed   Zoster (Shingles) Vaccine  Completed   HPV Vaccine  Aged Out   Immunization History  Administered Date(s) Administered   Fluad Quad(high Dose 65+) 06/23/2019, 06/03/2022   Hepatitis A 04/23/2006, 11/07/2006   Hepatitis B 04/21/2013, 05/25/2013, 08/19/2013   Influenza Split 07/03/2012, 06/11/2013   Influenza Whole 06/30/2017   Influenza, High Dose Seasonal PF 05/30/2016, 06/30/2018   Influenza-Unspecified 06/16/2014, 06/19/2015, 05/29/2020, 06/19/2021   MenQuadfi_Meningococcal Groups ACYW Conjugate 08/21/2021   PFIZER Comirnaty(Gray Top)Covid-19 Tri-Sucrose Vaccine 06/03/2022   PFIZER(Purple Top)SARS-COV-2 Vaccination 10/14/2019, 11/04/2019, 06/29/2020, 01/05/2021   PNEUMOCOCCAL CONJUGATE-20 08/16/2022   PPD Test 05/28/2016   Pfizer Covid-19 Vaccine Bivalent Booster 18yr & up 06/15/2021   Pneumococcal Conjugate-13 08/16/2015   Pneumococcal Polysaccharide-23 10/03/2016   Td 05/29/2005, 08/21/2021   Tdap 08/21/2021   Typhoid Live 08/21/2021   Yellow Fever 08/21/2021   Zoster  Recombinat (Shingrix) 01/24/2017, 05/14/2017   Zoster, Live 11/21/2014   Advanced directives: Yes; Please bring a copy of your health care power of attorney and living will to the office at your convenience.  Conditions/risks identified: Yes  Next appointment: Follow up in one year for your annual wellness visit.   Preventive Care 640Years and Older, Female Preventive care refers to lifestyle choices and visits with your health care provider that can promote health and wellness. What does preventive care include? A yearly physical exam. This is also called an annual well check. Dental exams once or twice a year. Routine eye exams. Ask your health care provider how often you should have your eyes checked. Personal lifestyle choices, including: Daily care of your teeth and gums. Regular physical activity. Eating a healthy diet. Avoiding tobacco and drug use. Limiting alcohol use. Practicing safe sex. Taking low-dose aspirin every day. Taking vitamin and mineral supplements as recommended by your health care provider. What happens during an annual well check? The services and screenings done by your health care provider during your annual well check will depend on your age, overall health, lifestyle risk factors, and family history of disease. Counseling  Your health care provider may ask you questions about your: Alcohol use. Tobacco use. Drug use. Emotional well-being. Home and relationship well-being. Sexual activity. Eating habits. History of falls. Memory and ability to understand (cognition). Work and work [employer]roductive health. Screening  You may have the following tests or measurements: Height, weight, and BMI. Blood pressure. Lipid and cholesterol levels. These may be  checked every 5 years, or more frequently if you are over 1 years old. Skin check. Lung cancer screening. You may have this screening every year starting at age 50 if you have a  30-pack-year history of smoking and currently smoke or have quit within the past 15 years. Fecal occult blood test (FOBT) of the stool. You may have this test every year starting at age 31. Flexible sigmoidoscopy or colonoscopy. You may have a sigmoidoscopy every 5 years or a colonoscopy every 10 years starting at age 58. Hepatitis C blood test. Hepatitis B blood test. Sexually transmitted disease (STD) testing. Diabetes screening. This is done by checking your blood sugar (glucose) after you have not eaten for a while (fasting). You may have this done every 1-3 years. Bone density scan. This is done to screen for osteoporosis. You may have this done starting at age 64. Mammogram. This may be done every 1-2 years. Talk to your health care provider about how often you should have regular mammograms. Talk with your health care provider about your test results, treatment options, and if necessary, the need for more tests. Vaccines  Your health care provider may recommend certain vaccines, such as: Influenza vaccine. This is recommended every year. Tetanus, diphtheria, and acellular pertussis (Tdap, Td) vaccine. You may need a Td booster every 10 years. Zoster vaccine. You may need this after age 71. Pneumococcal 13-valent conjugate (PCV13) vaccine. One dose is recommended after age 34. Pneumococcal polysaccharide (PPSV23) vaccine. One dose is recommended after age 28. Talk to your health care provider about which screenings and vaccines you need and how often you need them. This information is not intended to replace advice given to you by your health care provider. Make sure you discuss any questions you have with your health care provider. Document Released: 09/22/2015 Document Revised: 05/15/2016 Document Reviewed: 06/27/2015 Elsevier Interactive Patient Education  2017 [place] Prevention in the Home Falls can cause injuries. They can happen to people of all ages. There are many  things you can do to make your home safe and to help prevent falls. What can I do on the outside of my home? Regularly fix the edges of walkways and driveways and fix any cracks. Remove anything that might make you trip as you walk through a door, such as a raised step or threshold. Trim any bushes or trees on the path to your home. Use bright outdoor lighting. Clear any walking paths of anything that might make someone trip, such as rocks or tools. Regularly check to see if handrails are loose or broken. Make sure that both sides of any steps have handrails. Any raised decks and porches should have guardrails on the edges. Have any leaves, snow, or ice cleared regularly. Use sand or salt on walking paths during winter. Clean up any spills in your garage right away. This includes oil or grease spills. What can I do in the bathroom? Use night lights. Install grab bars by the toilet and in the tub and shower. Do not use towel bars as grab bars. Use non-skid mats or decals in the tub or shower. If you need to sit down in the shower, use a plastic, non-slip stool. Keep the floor dry. Clean up any water that spills on the floor as soon as it happens. Remove soap buildup in the tub or shower regularly. Attach bath mats securely with double-sided non-slip rug tape. Do not have throw rugs and other things on the floor that  can make you trip. What can I do in the bedroom? Use night lights. Make sure that you have a light by your bed that is easy to reach. Do not use any sheets or blankets that are too big for your bed. They should not hang down onto the floor. Have a firm chair that has side arms. You can use this for support while you get dressed. Do not have throw rugs and other things on the floor that can make you trip. What can I do in the kitchen? Clean up any spills right away. Avoid walking on wet floors. Keep items that you use a lot in easy-to-reach places. If you need to reach  something above you, use a strong step stool that has a grab bar. Keep electrical cords out of the way. Do not use floor polish or wax that makes floors slippery. If you must use wax, use non-skid floor wax. Do not have throw rugs and other things on the floor that can make you trip. What can I do with my stairs? Do not leave any items on the stairs. Make sure that there are handrails on both sides of the stairs and use them. Fix handrails that are broken or loose. Make sure that handrails are as long as the stairways. Check any carpeting to make sure that it is firmly attached to the stairs. Fix any carpet that is loose or worn. Avoid having throw rugs at the top or bottom of the stairs. If you do have throw rugs, attach them to the floor with carpet tape. Make sure that you have a light switch at the top of the stairs and the bottom of the stairs. If you do not have them, ask someone to add them for you. What else can I do to help prevent falls? Wear shoes that: Do not have high heels. Have rubber bottoms. Are comfortable and fit you well. Are closed at the toe. Do not wear sandals. If you use a stepladder: Make sure that it is fully opened. Do not climb a closed stepladder. Make sure that both sides of the stepladder are locked into place. Ask someone to hold it for you, if possible. Clearly mark and make sure that you can see: Any grab bars or handrails. First and last steps. Where the edge of each step is. Use tools that help you move around (mobility aids) if they are needed. These include: Canes. Walkers. Scooters. Crutches. Turn on the lights when you go into a dark area. Replace any light bulbs as soon as they burn out. Set up your furniture so you have a clear path. Avoid moving your furniture around. If any of your floors are uneven, fix them. If there are any pets around you, be aware of where they are. Review your medicines with your doctor. Some medicines can make you  feel dizzy. This can increase your chance of falling. Ask your doctor what other things that you can do to help prevent falls. This information is not intended to replace advice given to you by your health care provider. Make sure you discuss any questions you have with your health care provider. Document Released: 06/22/2009 Document Revised: 02/01/2016 Document Reviewed: 09/30/2014 Elsevier Interactive Patient Education  2017 [employer].

## 2022-08-26 NOTE — Progress Notes (Signed)
Virtual Visit via Telephone Note  I connected with  Catherine [name] on 08/26/22 at  3:00 PM EST by telephone and verified that I am speaking with the correct person using two identifiers.  Location: Patient: Home Provider: [facility] Persons participating in the virtual visit: [place]   I discussed the limitations, risks, security and privacy concerns of performing an evaluation and management service by telephone and the availability of in person appointments. The patient expressed understanding and agreed to proceed.  Interactive audio and video telecommunications were attempted between this nurse and patient, however failed, due to patient having technical difficulties OR patient did not have access to video capability.  We continued and completed visit with audio only.  Some vital signs may be absent or patient reported.   Sheral Flow, LPN  Subjective:   Catherine [name] is a 71 y.o. female who presents for Medicare Annual (Subsequent) preventive examination.  Review of Systems     Cardiac Risk Factors include: advanced age (>43mn, >>95women)     Objective:    Today's Vitals   08/26/22 1510 08/26/22 1511  Weight: 138 lb (62.6 kg)   Height: 5' 6.75" (1.695 m)   PainSc: 0-No pain 0-No pain   Body mass index is 21.78 kg/m.     08/26/2022    3:12 PM 08/22/2021    2:26 PM 06/27/2020   10:09 AM 11/29/2018    6:14 AM 11/03/2017    7:26 AM 10/30/2017    9:03 AM 06/17/2016    7:45 AM  Advanced Directives  Does Patient Have a Medical Advance Directive? Yes Yes Yes Yes Yes Yes Yes  Type of AParamedicof APlaquemineLiving will Living will [place]oshkon[place]Living will HJeffersontownLiving will HRockportLiving will HMonroviaLiving will HEast BarreLiving will  Does patient want to make changes to medical advance directive?  No - Patient declined No  - Patient declined      Copy of HH[place]villein Chart? No - copy requested  No - copy requested No - copy requested No - copy requested No - copy requested   Would patient like information on creating a medical advance directive?    No - Patient declined       Current Medications (verified) Outpatient Encounter Medications as of 08/26/2022  Medication Sig   calcium gluconate 500 MG tablet Take 500 mg by mouth daily.   Cholecalciferol (VITAMIN D) 1000 UNITS capsule Take 1,000 Units by mouth daily.   denosumab (PROLIA) 60 MG/ML SOLN injection Inject 60 mg into the skin every 6 (six) months. Administer in upper arm, thigh, or abdomen   glucosamine-chondroitin 500-400 MG tablet Take 1 tablet by mouth daily.   magnesium 30 MG tablet Take 30 mg by mouth 2 (two) times daily.   Multiple Vitamin (MULTIVITAMIN) capsule Take 1 capsule by mouth daily.   Omega-3 Fatty Acids (FISH OIL) 1000 MG CAPS Take 1,000 mg by mouth daily.   POTASSIUM PO Take by mouth.   scopolamine (TRANSDERM-SCOP) 1 MG/3DAYS APPLY 1 PATCH BEHIND THE EAR 4 HOURS PRIOR TO EVENT FOR MOTION SICKNESS. REPLACE EVERY 3 DAYS ON OPPOSITE EAR   Turmeric 500 MG CAPS Take 500 mg by mouth daily.    UNABLE TO FIND Med Name: fennel   No facility-administered encounter medications on file as of 08/26/2022.    Allergies (verified) Oysters [shellfish allergy], Avocado, and Latex   History: Past  Medical History:  Diagnosis Date   Cancer [facility])    basal cell back   Colon polyp 2017   Osteoporosis 10/2017   T score -2.9   Past Surgical History:  Procedure Laterality Date   COLONOSCOPY     COLONOSCOPY WITH PROPOFOL N/A 06/17/2016   Procedure: COLONOSCOPY WITH PROPOFOL;  Surgeon: Gatha Mayer, MD;  Location: [facility] ENDOSCOPY;  Service: Endoscopy;  Laterality: N/A;   COLONOSCOPY WITH PROPOFOL N/A 11/03/2017   Procedure: COLONOSCOPY WITH PROPOFOL;  Surgeon: Gatha Mayer, MD;  Location: [facility] ENDOSCOPY;  Service: Endoscopy;   Laterality: N/A;   HOT HEMOSTASIS N/A 06/17/2016   Procedure: HOT HEMOSTASIS (ARGON PLASMA COAGULATION/BICAP);  Surgeon: Gatha Mayer, MD;  Location: [facility];  Service: Endoscopy;  Laterality: N/A;   HOT HEMOSTASIS N/A 11/03/2017   Procedure: HOT HEMOSTASIS (ARGON PLASMA COAGULATION/BICAP);  Surgeon: Gatha Mayer, MD;  Location: [facility];  Service: Endoscopy;  Laterality: N/A;   POLYPECTOMY     Family History  Problem Relation Age of Onset   Hypothyroidism Mother    Thyroid disease Mother    Lung cancer Father        smoker   Thyroid disease Sister    Thyroid disease Sister    Thyroid disease Sister    Kidney Stones Sister    Colon cancer Neg Hx    Colon polyps Neg Hx    Esophageal cancer Neg Hx    Rectal cancer Neg Hx    Stomach cancer Neg Hx    Social History   Socioeconomic History   Marital status: Married    Spouse name: Not on file   Number of children: Not on file   Years of education: Not on file   Highest education level: Not on file  Occupational History   Occupation: retired  Tobacco Use   Smoking status: Never   Smokeless tobacco: Never  Vaping Use   Vaping Use: Never used  Substance and Sexual Activity   Alcohol use: Not Currently    Comment: wine with dinner   Drug use: No   Sexual activity: Yes    Birth control/protection: Post-menopausal    Comment: 1st intercourse 71 yo-Fewer than 5 partners  Other Topics Concern   Not on file  Social History Narrative   Exercises regularly   Social Determinants of Health   Financial Resource Strain: Low Risk  (08/26/2022)   Overall Financial Resource Strain (CARDIA)    Difficulty of Paying Living Expenses: Not hard at all  Food Insecurity: No Food Insecurity (08/26/2022)   Hunger Vital Sign    Worried About Running Out of Food in the Last Year: Never true    Ran Out of Food in the Last Year: Never true  Transportation Needs: No Transportation Needs (08/26/2022)   PRAPARE - Armed forces logistics/support/administrative officer (Medical): No    Lack of Transportation (Non-Medical): No  Physical Activity: Sufficiently Active (08/26/2022)   Exercise Vital Sign    Days of Exercise per Week: 6 days    Minutes of Exercise per Session: 60 min  Stress: No Stress Concern Present (08/26/2022)   [place]    Feeling of Stress : Not at all  Social Connections: [place] (08/26/2022)   Social Connection and Isolation Panel [NHANES]    Frequency of Communication with Friends and Family: More than three times a week    Frequency of Social Gatherings with Friends and Family: More  than three times a week    Attends Religious Services: More than 4 times per year    Active Member of Clubs or Organizations: Yes    Attends Music therapist: More than 4 times per year    Marital Status: Married    Tobacco Counseling Counseling given: Not Answered   Clinical Intake:  Pre-visit preparation completed: Yes  Pain : No/denies pain Pain Score: 0-No pain     BMI - recorded: 21.78 Nutritional Status: BMI of 19-24  Normal Nutritional Risks: None Diabetes: No  How often do you need to have someone help you when you read instructions, pamphlets, or other written materials from your doctor or pharmacy?: 1 - Never What is the last grade level you completed in school?: Docorate Degree  Diabetic? No  Interpreter Needed?: No  Information entered by :: Lisette Abu, LPN.   Activities of Daily Living    08/26/2022    3:21 PM  In your present state of health, do you have any difficulty performing the following activities:  Hearing? 0  Vision? 0  Difficulty concentrating or making decisions? 0  Walking or climbing stairs? 0  Dressing or bathing? 0  Doing errands, shopping? 0  Preparing Food and eating ? N  Using the Toilet? N  In the past six months, have you accidently leaked urine? N  Do you have  problems with loss of bowel control? N  Managing your Medications? N  Managing your Finances? N  Housekeeping or managing your Housekeeping? N    Patient Care Team: Binnie Rail, MD as PCP - General (Internal Medicine) Gatha Mayer, MD as Consulting Physician (Gastroenterology) Jola Schmidt, MD as Consulting Physician (Ophthalmology) Princess Bruins, MD as Consulting Physician (Obstetrics and Gynecology)  Indicate any recent Medical Services you may have received from other than [facility] providers in the past year (date may be approximate).     Assessment:   This is a routine wellness examination for Catherine [name].  Hearing/Vision screen Hearing Screening - Comments:: Denies hearing difficulties. Patient wears ear plugs when she goes to the gym due to the loudness in the gym.   Vision Screening - Comments:: Wears rx glasses - up to date with routine eye exams with Jola Schmidt, MD.   Dietary issues and exercise activities discussed: Current Exercise Habits: Home exercise routine;Structured exercise class, Type of exercise: walking;treadmill;stretching;strength training/weights;exercise ball;calisthenics;yoga, Time (Minutes): 60, Frequency (Times/Week): 6, Weekly Exercise (Minutes/Week): 360, Intensity: Moderate   Goals Addressed             This Visit's Progress    patient       My goal for 2024 is to continue to travel, enjoy life, and stay physically active because it motivates me.      Depression Screen    08/26/2022    3:21 PM 08/22/2021    2:28 PM 07/18/2021   12:42 PM 06/27/2020   10:07 AM 06/23/2019    8:27 AM 10/30/2017    9:04 AM 04/14/2017   11:18 AM  PHQ 2/9 Scores  PHQ - 2 Score 0 0 0 0 0 0 0  PHQ- 9 Score   0   0     Fall Risk    08/26/2022    3:14 PM 08/22/2021    2:28 PM 07/18/2021    8:24 AM 06/27/2020   10:09 AM 06/23/2019    8:27 AM  Fall Risk   Falls in the past year? 0 0 0 0 0  Number  falls in past yr: 0 0 0 0 0  Injury with Fall? 0 0 0 0    Risk for fall due to : No Fall Risks No Fall Risks No Fall Risks No Fall Risks   Follow up Falls prevention discussed Falls prevention discussed Falls evaluation completed Falls evaluation completed     Bells:  Any stairs in or around the home? Yes  If so, are there any without handrails? No  Home free of loose throw rugs in walkways, pet beds, electrical cords, etc? Yes  Adequate lighting in your home to reduce risk of falls? Yes   ASSISTIVE DEVICES UTILIZED TO PREVENT FALLS:  Life alert? No  Use of a cane, walker or w/c? No  Grab bars in the bathroom? Yes  Shower chair or bench in shower? No  Elevated toilet seat or a handicapped toilet? Yes   TIMED UP AND GO:  Was the test performed? No . Phone Visit   Cognitive Function:    10/30/2017    9:05 AM  MMSE - Mini Mental State Exam  Not completed: Refused        08/26/2022    3:35 PM  6CIT Screen  What Year? 0 points  What month? 0 points  What time? 0 points  Count back from 20 0 points  Months in reverse 0 points  Repeat phrase 0 points  Total Score 0 points    Immunizations Immunization History  Administered Date(s) Administered   Fluad Quad(high Dose 65+) 06/23/2019, 06/03/2022   Hepatitis A 04/23/2006, 11/07/2006   Hepatitis B 04/21/2013, 05/25/2013, 08/19/2013   Influenza Split 07/03/2012, 06/11/2013   Influenza Whole 06/30/2017   Influenza, High Dose Seasonal PF 05/30/2016, 06/30/2018   Influenza-Unspecified 06/16/2014, 06/19/2015, 05/29/2020, 06/19/2021   MenQuadfi_Meningococcal Groups ACYW Conjugate 08/21/2021   PFIZER Comirnaty(Gray Top)Covid-19 Tri-Sucrose Vaccine 06/03/2022   PFIZER(Purple Top)SARS-COV-2 Vaccination 10/14/2019, 11/04/2019, 06/29/2020, 01/05/2021   PNEUMOCOCCAL CONJUGATE-20 08/16/2022   PPD Test 05/28/2016   Pfizer Covid-19 Vaccine Bivalent Booster 24yr & up 06/15/2021   Pneumococcal Conjugate-13 08/16/2015   Pneumococcal Polysaccharide-23  10/03/2016   Td 05/29/2005, 08/21/2021   Tdap 08/21/2021   Typhoid Live 08/21/2021   Yellow Fever 08/21/2021   Zoster Recombinat (Shingrix) 01/24/2017, 05/14/2017   Zoster, Live 11/21/2014    TDAP status: Up to date  Flu Vaccine status: Up to date  Pneumococcal vaccine status: Up to date  Covid-19 vaccine status: Completed vaccines  Qualifies for Shingles Vaccine? Yes   Zostavax completed Yes   Shingrix Completed?: Yes  Screening Tests Health Maintenance  Topic Date Due   COVID-19 Vaccine (7 - 2023-24 season) 07/29/2022   Medicare Annual Wellness (AWV)  08/27/2023   MAMMOGRAM  03/05/2024   DEXA SCAN  03/05/2024   COLONOSCOPY (Pts 45-451yrInsurance coverage will need to be confirmed)  05/23/2031   DTaP/Tdap/Td (4 - Td or Tdap) 08/22/2031   Pneumonia Vaccine 6520Years old  Completed   INFLUENZA VACCINE  Completed   Hepatitis C Screening  Completed   Zoster Vaccines- Shingrix  Completed   HPV VACCINES  Aged Out    Health Maintenance  Health Maintenance Due  Topic Date Due   COVID-19 Vaccine (7 - 2023-24 season) 07/29/2022    Colorectal cancer screening: No longer required.   Mammogram status: Completed 03/05/2022. Repeat every year  Bone Density status: Completed 03/05/2022. Results reflect: Bone density results: OSTEOPOROSIS. Repeat every 1-2 years.  Lung Cancer Screening: (Low Dose CT Chest recommended if Age  55-80 years, 30 pack-year currently smoking OR have quit w/in 15years.) does not qualify.   Lung Cancer Screening Referral: no  Additional Screening:  Hepatitis C Screening: does qualify; Completed 08/16/2015  Vision Screening: Recommended annual ophthalmology exams for early detection of glaucoma and other disorders of the eye. Is the patient up to date with their annual eye exam?  Yes  Who is the provider or what is the name of the office in which the patient attends annual eye exams? Jola Schmidt, MD. If pt is not established with a provider, would  they like to be referred to a provider to establish care? No .   Dental Screening: Recommended annual dental exams for proper oral hygiene  Community Resource Referral / Chronic Care Management: CRR required this visit?  No   CCM required this visit?  No      [name]:     I have personally reviewed and noted the following in the patient's chart:   Medical and social history Use of alcohol, tobacco or illicit drugs  Current medications and supplements including opioid prescriptions. Patient is not currently taking opioid prescriptions. Functional ability and status Nutritional status Physical activity Advanced directives List of other physicians Hospitalizations, surgeries, and ER visits in previous 12 months Vitals Screenings to include cognitive, depression, and falls Referrals and appointments  In addition, I have reviewed and discussed with patient certain preventive protocols, quality metrics, and best practice recommendations. A written personalized care [name] for preventive services as well as general preventive health recommendations were provided to patient.     Sheral Flow, LPN   [id number]   Nurse Notes: N/A

## 2022-08-26 NOTE — Telephone Encounter (Signed)
AWV completed vis phone  -S. Indiana Pechacek,LPN

## 2022-08-29 ENCOUNTER — Telehealth: Payer: Self-pay | Admitting: Internal Medicine

## 2022-08-29 NOTE — Telephone Encounter (Signed)
Patient called and wanted to know if Acetazolamide '125mg'$  could be prescribed for her for altitude sickness because she is going out of town next week. Unfortunately Dr Quay Burow is out so she was wondering if someone else could send it ijn. Call back is [phone]. If able to please send in to  [place], [place]

## 2022-08-30 ENCOUNTER — Telehealth: Payer: Self-pay | Admitting: *Deleted

## 2022-08-30 DIAGNOSIS — M810 Age-related osteoporosis without current pathological fracture: Secondary | ICD-10-CM

## 2022-08-30 NOTE — Telephone Encounter (Signed)
Prolia insurance verification has been sent awaiting Summary of benefits  

## 2022-09-03 ENCOUNTER — Encounter: Payer: Self-pay | Admitting: Internal Medicine

## 2022-09-05 ENCOUNTER — Other Ambulatory Visit: Payer: Self-pay | Admitting: Internal Medicine

## 2022-09-05 DIAGNOSIS — Z2989 Encounter for other specified prophylactic measures: Secondary | ICD-10-CM | POA: Insufficient documentation

## 2022-09-05 MED ORDER — ACETAZOLAMIDE 125 MG PO TABS: 125.0000 mg | ORAL_TABLET | Freq: Two times a day (BID) | ORAL | 0 refills | Status: DC

## 2022-09-20 NOTE — Telephone Encounter (Addendum)
Deductible n/a  OOP MAX $4000 ($0 met)   Annual exam 01/31/2022  Calcium 9.7          Date  10/28/2022  Upcoming dental procedures NO  Is Prior Authorization required auth approved # [id number] valid through 01/24/2020-09/09/2023  Medication intake team   Pt estimated Cost $0  11/04/2022  Coverage Details: 0% one dose, 0% admin fee

## 2022-10-28 ENCOUNTER — Other Ambulatory Visit: Payer: Medicare PPO

## 2022-10-28 DIAGNOSIS — M810 Age-related osteoporosis without current pathological fracture: Secondary | ICD-10-CM

## 2022-10-29 LAB — CALCIUM: Calcium: 9.7 mg/dL (ref 8.6–10.4)

## 2022-11-04 ENCOUNTER — Ambulatory Visit (INDEPENDENT_AMBULATORY_CARE_PROVIDER_SITE_OTHER): Payer: Medicare PPO | Admitting: *Deleted

## 2022-11-04 DIAGNOSIS — M810 Age-related osteoporosis without current pathological fracture: Secondary | ICD-10-CM

## 2022-11-04 MED ORDER — DENOSUMAB 60 MG/ML ~~LOC~~ SOSY
60.0000 mg | PREFILLED_SYRINGE | Freq: Once | SUBCUTANEOUS | Status: AC
  Administered 2022-11-04: 60 mg via SUBCUTANEOUS

## 2022-11-04 MED ORDER — MEDROXYPROGESTERONE ACETATE 150 MG/ML IM SUSY: 150.0000 mg | PREFILLED_SYRINGE | Freq: Once | INTRAMUSCULAR | Status: DC

## 2022-11-19 DIAGNOSIS — H2513 Age-related nuclear cataract, bilateral: Secondary | ICD-10-CM | POA: Diagnosis not present

## 2022-11-19 DIAGNOSIS — H5213 Myopia, bilateral: Secondary | ICD-10-CM | POA: Diagnosis not present

## 2023-02-24 DIAGNOSIS — M25561 Pain in right knee: Secondary | ICD-10-CM | POA: Diagnosis not present

## 2023-03-10 ENCOUNTER — Encounter: Payer: Self-pay | Admitting: Obstetrics & Gynecology

## 2023-03-10 ENCOUNTER — Other Ambulatory Visit (HOSPITAL_COMMUNITY)
Admission: RE | Admit: 2023-03-10 | Discharge: 2023-03-10 | Disposition: A | Discharge status: Home / Self Care | Payer: Medicare PPO | Source: Ambulatory Visit | Attending: Obstetrics & Gynecology | Admitting: Obstetrics & Gynecology

## 2023-03-10 ENCOUNTER — Ambulatory Visit (INDEPENDENT_AMBULATORY_CARE_PROVIDER_SITE_OTHER): Payer: Medicare PPO | Admitting: Obstetrics & Gynecology

## 2023-03-10 VITALS — BP 110/72 | HR 74 | Ht 66.0 in | Wt 142.0 lb

## 2023-03-10 DIAGNOSIS — R350 Frequency of micturition: Secondary | ICD-10-CM | POA: Diagnosis not present

## 2023-03-10 DIAGNOSIS — Z01419 Encounter for gynecological examination (general) (routine) without abnormal findings: Secondary | ICD-10-CM | POA: Insufficient documentation

## 2023-03-10 DIAGNOSIS — M810 Age-related osteoporosis without current pathological fracture: Secondary | ICD-10-CM

## 2023-03-10 DIAGNOSIS — Z780 Asymptomatic menopausal state: Secondary | ICD-10-CM | POA: Diagnosis not present

## 2023-03-10 LAB — URINALYSIS, COMPLETE W/RFL CULTURE
Bacteria, UA: NONE SEEN /HPF
Bilirubin Urine: NEGATIVE
Glucose, UA: NEGATIVE
Hgb urine dipstick: NEGATIVE
Hyaline Cast: NONE SEEN /LPF
Ketones, ur: NEGATIVE
Leukocyte Esterase: NEGATIVE
Nitrites, Initial: NEGATIVE
Protein, ur: NEGATIVE
RBC / HPF: NONE SEEN /HPF (ref 0–2)
Specific Gravity, Urine: 1.015 (ref 1.001–1.035)
WBC, UA: NONE SEEN /HPF (ref 0–5)
pH: 5.5 (ref 5.0–8.0)

## 2023-03-10 LAB — NO CULTURE INDICATED

## 2023-03-10 NOTE — Progress Notes (Signed)
Catherine [name] 04/13/1951 [id number]   History:    72 y.o. G0 Married   RP:  Established patient presenting for annual gyn exam    HPI: Postmenopause, well on no HRT.  No PMB.  No pelvic pain.  No h/o abnormal Pap.  Pap 01/2021 Neg.  Would like Paps every 2 years. Pap reflex today. Breasts normal.  Last mammo Neg in 02/2022.  Mammo scheduled at [facility].  BD 02/2022 showed improvement on Prolia. Urinary frequency, no dysuria.  BMs normal.  Colono 05/2021.  BMI 22.92.  Health labs with Fam MD.   Past medical history,surgical history, family history and social history were all reviewed and documented in the EPIC chart.  Gynecologic History Patient's last menstrual period was 09/09/1996.  Obstetric History OB History  Gravida Para Term Preterm AB Living  0            SAB IAB Ectopic Multiple Live Births                ROS: A ROS was performed and pertinent positives and negatives are included in the history. GENERAL: No fevers or chills. HEENT: No change in vision, no earache, sore throat or sinus congestion. NECK: No pain or stiffness. CARDIOVASCULAR: No chest pain or pressure. No palpitations. PULMONARY: No shortness of breath, cough or wheeze. GASTROINTESTINAL: No abdominal pain, nausea, vomiting or diarrhea, melena or bright red blood per rectum. GENITOURINARY: No urinary frequency, urgency, hesitancy or dysuria. MUSCULOSKELETAL: No joint or muscle pain, no back pain, no recent trauma. DERMATOLOGIC: No rash, no itching, no lesions. ENDOCRINE: No polyuria, polydipsia, no heat or cold intolerance. No recent change in weight. HEMATOLOGICAL: No anemia or easy bruising or bleeding. NEUROLOGIC: No headache, seizures, numbness, tingling or weakness. PSYCHIATRIC: No depression, no loss of interest in normal activity or change in sleep pattern.     Exam:   Pulse 74   Ht 5\' 6"  (1.676 m)   Wt 142 lb (64.4 kg)   LMP 09/09/1996 Comment: sexually active  SpO2 98%   BMI 22.92 kg/m   Body  mass index is 22.92 kg/m.  General appearance : Well developed well nourished female. No acute distress HEENT: Eyes: no retinal hemorrhage or exudates,  Neck supple, trachea midline, no carotid bruits, no thyroidmegaly Lungs: Clear to auscultation, no rhonchi or wheezes, or rib retractions  Heart: Regular rate and rhythm, no murmurs or gallops Breast:Examined in sitting and supine position were symmetrical in appearance, no palpable masses or tenderness,  no skin retraction, no nipple inversion, no nipple discharge, no skin discoloration, no axillary or supraclavicular lymphadenopathy Abdomen: no palpable masses or tenderness, no rebound or guarding Extremities: no edema or skin discoloration or tenderness  Pelvic: Vulva: Normal             Vagina: No gross lesions or discharge  Cervix: No gross lesions or discharge. Pap reflex done.  Uterus  AV, normal size, shape and consistency, non-tender and mobile  Adnexa  Without masses or tenderness  Anus: Normal  U/A completely Negative   Assessment/Plan:  72 y.o. female for annual exam   1. Encounter for routine gynecological examination with Papanicolaou smear of cervix Postmenopause, well on no HRT.  No PMB.  No pelvic pain.  No h/o abnormal Pap.  Pap 01/2021 Neg.  Would like Paps every 2 years. Pap reflex today. Breasts normal.  Last mammo Neg in 02/2022.  Mammo scheduled at [facility].  BD 02/2022 showed improvement on Prolia. Urinary frequency, no  dysuria.  BMs normal.  Colono 05/2021.  BMI 22.92.  Health labs with Fam MD. - Cytology - PAP( [place])  2. Postmenopause Postmenopause, well on no HRT.  No PMB.  No pelvic pain.    3. Age-related osteoporosis without current pathological fracture  BD 02/2022 showed improvement on Prolia. BD every 2 years.  4. Urinary frequency U/A completely Negative - Urinalysis,Complete w/RFL Culture   Genia Del MD, 4:03 PM

## 2023-03-11 DIAGNOSIS — Z1231 Encounter for screening mammogram for malignant neoplasm of breast: Secondary | ICD-10-CM | POA: Diagnosis not present

## 2023-03-11 LAB — HM MAMMOGRAPHY

## 2023-03-11 LAB — CYTOLOGY - PAP: Diagnosis: NEGATIVE

## 2023-03-14 DIAGNOSIS — M25561 Pain in right knee: Secondary | ICD-10-CM | POA: Diagnosis not present

## 2023-03-27 DIAGNOSIS — M25561 Pain in right knee: Secondary | ICD-10-CM | POA: Diagnosis not present

## 2023-04-24 ENCOUNTER — Telehealth: Payer: Self-pay | Admitting: *Deleted

## 2023-04-24 DIAGNOSIS — M810 Age-related osteoporosis without current pathological fracture: Secondary | ICD-10-CM

## 2023-04-24 NOTE — Telephone Encounter (Signed)
Deductible:  No  OOP MAX: No  Annual exam: 03-10-23 ML  Calcium:        9.7    Date: 10-28-22  Upcoming dental procedures: No   Hx of Kidney Disease: No   Last Bone Density Scan: 03-05-22   Is Prior Authorization needed: yes on file through 09-09-23. Reference #: [id number].  Pt estimated Cost: ~$80     Coverage Details: Prolia and the administration are subject to a $40 copay which contributes to $4000 OOP max. No deductible or coinsurance applies.

## 2023-04-24 NOTE — Telephone Encounter (Signed)
Insurance information submitted to [employer] portal. Will await summary of benefits for prolia.    

## 2023-04-24 NOTE — Telephone Encounter (Signed)
Message left to return call to Emily at [phone].   

## 2023-05-06 MED ORDER — DENOSUMAB 60 MG/ML ~~LOC~~ SOSY
60.0000 mg | PREFILLED_SYRINGE | Freq: Once | SUBCUTANEOUS | Status: AC
Start: 2023-05-07 — End: ?
  Administered 2023-05-07: 60 mg via SUBCUTANEOUS

## 2023-05-06 NOTE — Telephone Encounter (Signed)
Patient returned call. Benefits reviewed with patient and she verbalized understanding. Appointment scheduled for 05/07/23. Order placed for prolia and Summary of benefits scanned into epic.   Encounter closed.

## 2023-05-06 NOTE — Telephone Encounter (Signed)
Message left to return call to Triage/ Irving Burton at [phone]. Please forward to me. Need to review Benefits for Prolia with patient prior to injection that was scheduled tomorrow.

## 2023-05-07 ENCOUNTER — Ambulatory Visit (INDEPENDENT_AMBULATORY_CARE_PROVIDER_SITE_OTHER): Payer: Medicare PPO | Admitting: *Deleted

## 2023-05-07 DIAGNOSIS — M810 Age-related osteoporosis without current pathological fracture: Secondary | ICD-10-CM | POA: Diagnosis not present

## 2023-05-09 ENCOUNTER — Telehealth: Payer: Self-pay | Admitting: *Deleted

## 2023-05-09 NOTE — Telephone Encounter (Signed)
Patient left message requesting return call from [facility] regarding Prolia.   Call returned to patient, advised Irving Burton is out of the office until next week, will forward your call for f/u when she returns on 9/5. If you need immediate assistance, return call to [facility] Triage at [phone], opt 4.   Routing to [employer]

## 2023-05-13 DIAGNOSIS — M25561 Pain in right knee: Secondary | ICD-10-CM | POA: Diagnosis not present

## 2023-05-15 NOTE — Telephone Encounter (Signed)
Call to patient. Questions answered in regards to prolia and PCP. Patient also has dental cleaning on 9/9 asking if she needed to wait. RN advised cleaning okay, advised patient to tell dentist she is taking prolia, so record can be made of that in her dental chart. Patient agreeable.   Encounter closed.

## 2023-06-03 DIAGNOSIS — L578 Other skin changes due to chronic exposure to nonionizing radiation: Secondary | ICD-10-CM | POA: Diagnosis not present

## 2023-06-03 DIAGNOSIS — D2271 Melanocytic nevi of right lower limb, including hip: Secondary | ICD-10-CM | POA: Diagnosis not present

## 2023-06-03 DIAGNOSIS — L820 Inflamed seborrheic keratosis: Secondary | ICD-10-CM | POA: Diagnosis not present

## 2023-06-03 DIAGNOSIS — Z86018 Personal history of other benign neoplasm: Secondary | ICD-10-CM | POA: Diagnosis not present

## 2023-06-03 DIAGNOSIS — Z85828 Personal history of other malignant neoplasm of skin: Secondary | ICD-10-CM | POA: Diagnosis not present

## 2023-06-03 DIAGNOSIS — L821 Other seborrheic keratosis: Secondary | ICD-10-CM | POA: Diagnosis not present

## 2023-06-03 DIAGNOSIS — D225 Melanocytic nevi of trunk: Secondary | ICD-10-CM | POA: Diagnosis not present

## 2023-06-03 DIAGNOSIS — L814 Other melanin hyperpigmentation: Secondary | ICD-10-CM | POA: Diagnosis not present

## 2023-06-03 DIAGNOSIS — D2261 Melanocytic nevi of right upper limb, including shoulder: Secondary | ICD-10-CM | POA: Diagnosis not present

## 2023-06-03 DIAGNOSIS — N6459 Other signs and symptoms in breast: Secondary | ICD-10-CM | POA: Diagnosis not present

## 2023-06-16 NOTE — Patient Instructions (Addendum)
Blood work was ordered.   The lab is on the first floor.    Medications changes include :   none    A referral was ordered and someone will call you to schedule an appointment.     Return in about 1 year (around 06/16/2024) for Physical Exam.    Health Maintenance, Female Adopting a healthy lifestyle and getting preventive care are important in promoting health and wellness. Ask your health care provider about: The right schedule for you to have regular tests and exams. Things you can do on your own to prevent diseases and keep yourself healthy. What should I know about diet, weight, and exercise? Eat a healthy diet  Eat a diet that includes plenty of vegetables, fruits, low-fat dairy products, and lean protein. Do not eat a lot of foods that are high in solid fats, added sugars, or sodium. Maintain a healthy weight Body mass index (BMI) is used to identify weight problems. It estimates body fat based on height and weight. Your health care provider can help determine your BMI and help you achieve or maintain a healthy weight. Get regular exercise Get regular exercise. This is one of the most important things you can do for your health. Most adults should: Exercise for at least 150 minutes each week. The exercise should increase your heart rate and make you sweat (moderate-intensity exercise). Do strengthening exercises at least twice a week. This is in addition to the moderate-intensity exercise. Spend less time sitting. Even light physical activity can be beneficial. Watch cholesterol and blood lipids Have your blood tested for lipids and cholesterol at 73 years of age, then have this test every 5 years. Have your cholesterol levels checked more often if: Your lipid or cholesterol levels are high. You are older than 73 years of age. You are at high risk for heart disease. What should I know about cancer screening? Depending on your health history and family history,  you may need to have cancer screening at various ages. This may include screening for: Breast cancer. Cervical cancer. Colorectal cancer. Skin cancer. Lung cancer. What should I know about heart disease, diabetes, and high blood pressure? Blood pressure and heart disease High blood pressure causes heart disease and increases the risk of stroke. This is more likely to develop in people who have high blood pressure readings or are overweight. Have your blood pressure checked: Every 3-5 years if you are 71-17 years of age. Every year if you are 32 years old or older. Diabetes Have regular diabetes screenings. This checks your fasting blood sugar level. Have the screening done: Once every three years after age 31 if you are at a normal weight and have a low risk for diabetes. More often and at a younger age if you are overweight or have a high risk for diabetes. What should I know about preventing infection? Hepatitis B If you have a higher risk for hepatitis B, you should be screened for this virus. Talk with your health care provider to find out if you are at risk for hepatitis B infection. Hepatitis C Testing is recommended for: Everyone born from [birth year] through 1966. Anyone with known risk factors for hepatitis C. Sexually transmitted infections (STIs) Get screened for STIs, including gonorrhea and chlamydia, if: You are sexually active and are younger than 73 years of age. You are older than 73 years of age and your health care provider tells you that you are at risk for this  type of infection. Your sexual activity has changed since you were last screened, and you are at increased risk for chlamydia or gonorrhea. Ask your health care provider if you are at risk. Ask your health care provider about whether you are at high risk for HIV. Your health care provider may recommend a prescription medicine to help prevent HIV infection. If you choose to take medicine to prevent HIV, you should  first get tested for HIV. You should then be tested every 3 months for as long as you are taking the medicine. Pregnancy If you are about to stop having your period (premenopausal) and you may become pregnant, seek counseling before you get pregnant. Take 400 to 800 micrograms (mcg) of folic acid every day if you become pregnant. Ask for birth control (contraception) if you want to prevent pregnancy. Osteoporosis and menopause Osteoporosis is a disease in which the bones lose minerals and strength with aging. This can result in bone fractures. If you are 66 years old or older, or if you are at risk for osteoporosis and fractures, ask your health care provider if you should: Be screened for bone loss. Take a calcium or vitamin D supplement to lower your risk of fractures. Be given hormone replacement therapy (HRT) to treat symptoms of menopause. Follow these instructions at home: Alcohol use Do not drink alcohol if: Your health care provider tells you not to drink. You are pregnant, may be pregnant, or are planning to become pregnant. If you drink alcohol: Limit how much you have to: 0-1 drink a day. Know how much alcohol is in your drink. In the [place] one drink equals one 12 oz bottle of beer (355 mL), one 5 oz glass of wine (148 mL), or one 1 oz glass of hard liquor (44 mL). Lifestyle Do not use any products that contain nicotine or tobacco. These products include cigarettes, chewing tobacco, and vaping devices, such as e-cigarettes. If you need help quitting, ask your health care provider. Do not use street drugs. Do not share needles. Ask your health care provider for help if you need support or information about quitting drugs. General instructions Schedule regular health, dental, and eye exams. Stay current with your vaccines. Tell your health care provider if: You often feel depressed. You have ever been abused or do not feel safe at home. Summary Adopting a healthy lifestyle  and getting preventive care are important in promoting health and wellness. Follow your health care provider's instructions about healthy diet, exercising, and getting tested or screened for diseases. Follow your health care provider's instructions on monitoring your cholesterol and blood pressure. This information is not intended to replace advice given to you by your health care provider. Make sure you discuss any questions you have with your health care provider. Document Revised: 01/15/2021 Document Reviewed: 01/15/2021 Elsevier Patient Education  2024 [employer].

## 2023-06-16 NOTE — Progress Notes (Unsigned)
Subjective:    Patient ID: Catherine [name], female    DOB: 1950-09-16, 72 y.o.   MRN: [id number]      HPI Catherine [name] is here for a Physical exam and her chronic medical problems.   Tore cartilage in right knee - saw ortho.  May need to consider arthroscopic surgery.    Medications and allergies reviewed with patient and updated if appropriate.  Current Outpatient Medications on File Prior to Visit  Medication Sig Dispense Refill   calcium gluconate 500 MG tablet Take 500 mg by mouth daily.     Cholecalciferol (VITAMIN D) 1000 UNITS capsule Take 1,000 Units by mouth daily.     denosumab (PROLIA) 60 MG/ML SOLN injection Inject 60 mg into the skin every 6 (six) months. Administer in upper arm, thigh, or abdomen     glucosamine-chondroitin 500-400 MG tablet Take 1 tablet by mouth daily.     magnesium 30 MG tablet Take 30 mg by mouth 2 (two) times daily.     Multiple Vitamin (MULTIVITAMIN) capsule Take 1 capsule by mouth daily.     Omega-3 Fatty Acids (FISH OIL) 1000 MG CAPS Take 1,000 mg by mouth daily.     POTASSIUM PO Take by mouth.     Turmeric 500 MG CAPS Take 500 mg by mouth daily.      No current facility-administered medications on file prior to visit.    Review of Systems  Constitutional:  Negative for fever.  HENT:  Positive for hearing loss (subtle) and tinnitus (occ - mild - improved with pushing on tragus). Negative for ear pain (no popping / pressure).   Eyes:  Negative for visual disturbance.  Respiratory:  Negative for cough, shortness of breath and wheezing.   Cardiovascular:  Negative for chest pain, palpitations and leg swelling.  Gastrointestinal:  Negative for abdominal pain, blood in stool, constipation and diarrhea.       No gerd  Genitourinary:  Negative for dysuria.  Musculoskeletal:  Positive for arthralgias (knee (right)). Negative for back pain.  Skin:  Negative for rash.  Neurological:  Negative for light-headedness and headaches.   Psychiatric/Behavioral:  Negative for dysphoric mood. The patient is not nervous/anxious.        Objective:   Vitals:   06/17/23 0845  BP: 108/72  Pulse: (!) 51  Temp: 97.9 F (36.6 C)  SpO2: 96%   Filed Weights   06/17/23 0845  Weight: 137 lb (62.1 kg)   Body mass index is 22.11 kg/m.  BP Readings from Last 3 Encounters:  06/17/23 108/72  03/10/23 110/72  01/31/22 118/68    Wt Readings from Last 3 Encounters:  06/17/23 137 lb (62.1 kg)  03/10/23 142 lb (64.4 kg)  08/26/22 138 lb (62.6 kg)       Physical Exam Constitutional: She appears well-developed and well-nourished. No distress.  HENT:  Head: Normocephalic and atraumatic.  Right Ear: External ear normal. Normal ear canal and TM Left Ear: External ear normal.  Normal ear canal and TM Mouth/Throat: Oropharynx is clear and moist.  Eyes: Conjunctivae normal.  Neck: Neck supple. No tracheal deviation present. No thyromegaly present.  No carotid bruit  Cardiovascular: Normal rate, regular rhythm and normal heart sounds.   No murmur heard.  No edema. Pulmonary/Chest: Effort normal and breath sounds normal. No respiratory distress. She has no wheezes. She has no rales.  Breast: deferred   Abdominal: Soft. She exhibits no distension. There is no tenderness.  Lymphadenopathy: She has no cervical adenopathy.  Skin: Skin is warm and dry. She is not diaphoretic.  Psychiatric: She has a normal mood and affect. Her behavior is normal.     Lab Results  Component Value Date   WBC 5.3 07/18/2021   HGB 14.2 07/18/2021   HCT 43.1 07/18/2021   PLT 310.0 07/18/2021   GLUCOSE 94 07/18/2021   CHOL 200 07/18/2021   TRIG 55.0 07/18/2021   HDL 79.00 07/18/2021   LDLDIRECT 148.0 07/03/2012   LDLCALC 110 (H) 07/18/2021   ALT 13 07/18/2021   AST 22 07/18/2021   NA 139 07/18/2021   K 4.9 07/18/2021   CL 103 07/18/2021   CREATININE 0.86 07/18/2021   BUN 13 07/18/2021   CO2 29 07/18/2021   TSH 2.92 07/18/2021          Assessment & Plan:   Physical exam: Screening blood work  ordered Exercise  regular - not as much with current knee pain Weight  normal Substance abuse  none   Reviewed recommended immunizations.   Health Maintenance  Topic Date Due   COVID-19 Vaccine (7 - 2023-24 season) 07/03/2023 (Originally 05/11/2023)   INFLUENZA VACCINE  12/08/2023 (Originally 04/10/2023)   Medicare Annual Wellness (AWV)  08/27/2023   DEXA SCAN  03/05/2024   MAMMOGRAM  03/10/2025   Colonoscopy  05/23/2031   DTaP/Tdap/Td (4 - Td or Tdap) 08/22/2031   Pneumonia Vaccine 29+ Years old  Completed   Hepatitis C Screening  Completed   Zoster Vaccines- Shingrix  Completed   HPV VACCINES  Aged Out          See Problem List for Assessment and Plan of chronic medical problems.

## 2023-06-17 ENCOUNTER — Encounter: Payer: Self-pay | Admitting: Internal Medicine

## 2023-06-17 ENCOUNTER — Ambulatory Visit (INDEPENDENT_AMBULATORY_CARE_PROVIDER_SITE_OTHER): Payer: Medicare PPO | Admitting: Internal Medicine

## 2023-06-17 VITALS — BP 108/72 | HR 51 | Temp 97.9°F | Ht 66.0 in | Wt 137.0 lb

## 2023-06-17 DIAGNOSIS — M25561 Pain in right knee: Secondary | ICD-10-CM | POA: Diagnosis not present

## 2023-06-17 DIAGNOSIS — I700 Atherosclerosis of aorta: Secondary | ICD-10-CM | POA: Diagnosis not present

## 2023-06-17 DIAGNOSIS — Z0000 Encounter for general adult medical examination without abnormal findings: Secondary | ICD-10-CM | POA: Diagnosis not present

## 2023-06-17 DIAGNOSIS — E7800 Pure hypercholesterolemia, unspecified: Secondary | ICD-10-CM

## 2023-06-17 DIAGNOSIS — R768 Other specified abnormal immunological findings in serum: Secondary | ICD-10-CM | POA: Diagnosis not present

## 2023-06-17 DIAGNOSIS — M810 Age-related osteoporosis without current pathological fracture: Secondary | ICD-10-CM

## 2023-06-17 LAB — COMPREHENSIVE METABOLIC PANEL
ALT: 15 U/L (ref 0–35)
AST: 23 U/L (ref 0–37)
Albumin: 4.4 g/dL (ref 3.5–5.2)
Alkaline Phosphatase: 51 U/L (ref 39–117)
BUN: 14 mg/dL (ref 6–23)
CO2: 20 meq/L (ref 19–32)
Calcium: 9.5 mg/dL (ref 8.4–10.5)
Chloride: 107 meq/L (ref 96–112)
Creatinine, Ser: 0.81 mg/dL (ref 0.40–1.20)
GFR: 71.88 mL/min (ref >60.00)
Glucose, Bld: 91 mg/dL (ref 70–99)
Potassium: 4.6 meq/L (ref 3.5–5.1)
Sodium: 142 meq/L (ref 135–145)
Total Bilirubin: 0.5 mg/dL (ref 0.2–1.2)
Total Protein: 7.5 g/dL (ref 6.0–8.3)

## 2023-06-17 LAB — CBC WITH DIFFERENTIAL/PLATELET
Basophils Absolute: 0 10*3/uL (ref 0.0–0.1)
Basophils Relative: 0.8 % (ref 0.0–3.0)
Eosinophils Absolute: 0 10*3/uL (ref 0.0–0.7)
Eosinophils Relative: 0.8 % (ref 0.0–5.0)
HCT: 44.1 % (ref 36.0–46.0)
Hemoglobin: 14.3 g/dL (ref 12.0–15.0)
Lymphocytes Relative: 33.6 % (ref 12.0–46.0)
Lymphs Abs: 1.8 10*3/uL (ref 0.7–4.0)
MCHC: 32.5 g/dL (ref 30.0–36.0)
MCV: 98.2 fL (ref 78.0–100.0)
Monocytes Absolute: 0.4 10*3/uL (ref 0.1–1.0)
Monocytes Relative: 7.1 % (ref 3.0–12.0)
Neutro Abs: 3.1 10*3/uL (ref 1.4–7.7)
Neutrophils Relative %: 57.7 % (ref 43.0–77.0)
Platelets: 291 10*3/uL (ref 150.0–400.0)
RBC: 4.49 Mil/uL (ref 3.87–5.11)
RDW: 14.6 % (ref 11.5–15.5)
WBC: 5.4 10*3/uL (ref 4.0–10.5)

## 2023-06-17 LAB — LIPID PANEL
Cholesterol: 228 mg/dL — ABNORMAL HIGH (ref 0–200)
HDL: 92.7 mg/dL (ref >39.00)
LDL Cholesterol: 125 mg/dL — ABNORMAL HIGH (ref 0–99)
NonHDL: 135.62
Total CHOL/HDL Ratio: 2
Triglycerides: 54 mg/dL (ref 0.0–149.0)
VLDL: 10.8 mg/dL (ref 0.0–40.0)

## 2023-06-17 LAB — VITAMIN D 25 HYDROXY (VIT D DEFICIENCY, FRACTURES): VITD: 58.73 ng/mL (ref 30.00–100.00)

## 2023-06-17 LAB — TSH: TSH: 3.1 u[IU]/mL (ref 0.35–5.50)

## 2023-06-17 NOTE — Assessment & Plan Note (Signed)
Chronic  Clinically euthyroid Check tsh  Discussed that as long as her thyroid functions in normal range no treatment is necessary

## 2023-06-17 NOTE — Assessment & Plan Note (Signed)
Chronic Mild Lab Results  Component Value Date   LDLCALC 110 (H) 07/18/2021   LDL above goal but low risk Discussed low dose statin, coronary artery calcium test Regular exercise, low cholesterol/fat diet discussed Lipids today

## 2023-06-17 NOTE — Assessment & Plan Note (Signed)
Chronic Doing prolia via gyn dexa via gyn Continue regular exercise continue calcium and vitamin d daily Will check vitamin D level, CBC, CMP, TSH

## 2023-06-17 NOTE — Assessment & Plan Note (Signed)
Acute Cartilage tear Following [facility]

## 2023-06-17 NOTE — Assessment & Plan Note (Addendum)
Chronic Mild aortic atherosclerosis Check lipid panel, CMP, TSH Diet controlled - discussed goal of LDL < 70  - should consider low dose statin - consider CT CAC score Regular exercise and healthy diet encouraged

## 2023-06-27 ENCOUNTER — Other Ambulatory Visit: Payer: Self-pay | Admitting: Internal Medicine

## 2023-07-04 DIAGNOSIS — M25561 Pain in right knee: Secondary | ICD-10-CM | POA: Diagnosis not present

## 2023-08-27 DIAGNOSIS — Z23 Encounter for immunization: Secondary | ICD-10-CM | POA: Diagnosis not present

## 2023-08-29 DIAGNOSIS — M25561 Pain in right knee: Secondary | ICD-10-CM | POA: Diagnosis not present

## 2023-10-20 ENCOUNTER — Telehealth: Payer: Self-pay | Admitting: *Deleted

## 2023-10-20 DIAGNOSIS — M810 Age-related osteoporosis without current pathological fracture: Secondary | ICD-10-CM

## 2023-10-20 NOTE — Telephone Encounter (Signed)
Insurance information submitted to [employer] portal. Will await summary of benefits for prolia.    

## 2023-10-21 MED ORDER — DENOSUMAB 60 MG/ML ~~LOC~~ SOSY
60.0000 mg | PREFILLED_SYRINGE | Freq: Once | SUBCUTANEOUS | Status: AC
Start: 2023-11-24 — End: 2023-11-24
  Administered 2023-11-24: 60 mg via SUBCUTANEOUS

## 2023-10-21 NOTE — Telephone Encounter (Signed)
Call to patient. Summary of benefits for prolia reviewed with patient and she verbalized understanding. Nurse visit scheduled for 11/24/22 at 1015 per patient request. Patient agreeable to date and time of appointment. Order for prolia placed. Summary of benefits scanned into Epic.   Encounter closed.

## 2023-10-21 NOTE — Telephone Encounter (Signed)
Deductible:  none  OOP MAX: none  Annual exam: 03/10/23 ML  Calcium:     9.5       Date: 06/17/23  Upcoming dental procedures: No   Hx of Kidney Disease: No   Last Bone Density Scan: 03/05/22  Is Prior Authorization needed: yes, on file. Reference #: [id number]  Pt estimated Cost: $40

## 2023-10-21 NOTE — Telephone Encounter (Signed)
Message left to return call to [place] at [phone].

## 2023-10-31 DIAGNOSIS — M25561 Pain in right knee: Secondary | ICD-10-CM | POA: Diagnosis not present

## 2023-11-05 ENCOUNTER — Ambulatory Visit: Payer: Self-pay | Admitting: Internal Medicine

## 2023-11-05 ENCOUNTER — Telehealth: Payer: Self-pay | Admitting: Internal Medicine

## 2023-11-05 MED ORDER — AZITHROMYCIN 500 MG PO TABS: 500.0000 mg | ORAL_TABLET | Freq: Every day | ORAL | 0 refills | Status: AC

## 2023-11-05 NOTE — Telephone Encounter (Signed)
 Attempted to call patient r/t need of Azthromycin -for travelers diarrhea/traveling oversees : phone number on chart is invalid.

## 2023-11-05 NOTE — Telephone Encounter (Signed)
 Pt is requesting Azthromycin -for travelers diarrhea/traveling oversees. Please advise

## 2023-11-05 NOTE — Addendum Note (Signed)
 Addended by: Pincus Sanes on: 11/05/2023 04:25 PM   Modules accepted: Orders

## 2023-11-05 NOTE — Telephone Encounter (Signed)
It was sent to the pharmacy

## 2023-11-05 NOTE — Telephone Encounter (Signed)
 Copied from CRM [id number]. Topic: Clinical - Medication Refill >> Nov 05, 2023 10:23 AM Fredrich Romans wrote: Most Recent Primary Care Visit:  Provider: BURNS, Bobette Mo  Department: [facility]  Visit Type: PHYSICAL  Date: 06/17/2023  Medication: Azthromycin -for travelers diarrhea/traveling oversees  Has the patient contacted their pharmacy? No (Agent: If no, request that the patient contact the pharmacy for the refill. If patient does not wish to contact the pharmacy document the reason why and proceed with request.) (Agent: If yes, when and what did the pharmacy advise?)  Is this the correct pharmacy for this prescription? Yes If no, delete pharmacy and type the correct one.  This is the patient's preferred pharmacy:  [id number] - [place], [place] - [address] AT [facility] OF [address] & [address]  Phone: [phone] Fax: [phone]      Has the prescription been filled recently? No  Is the patient out of the medication? Yes  Has the patient been seen for an appointment in the last year OR does the patient have an upcoming appointment? Yes  Can we respond through MyChart? Yes  Agent: Please be advised that Rx refills may take up to 3 business days. We ask that you follow-up with your pharmacy.

## 2023-11-05 NOTE — Telephone Encounter (Signed)
 Several attempts made to contact pt for triage r/t travelers medication needed: phone number listed on chart invalid: will route to PCP office.

## 2023-11-05 NOTE — Telephone Encounter (Signed)
 See triage note.

## 2023-11-06 NOTE — Telephone Encounter (Signed)
 Called pt and she reports she would like more than 1 dose sent in, just in case she gets it the next week. If it will last for 2 weeks she is ok what she has but just wants to make sure she has enough to last her in case it comes back

## 2023-11-06 NOTE — Telephone Encounter (Signed)
 I do not recommend a second dose.  The medication will still be in her system because it is a long-acting medication and if she has a second episode which would be very unlikely she would need to be evaluated

## 2023-11-07 ENCOUNTER — Telehealth: Payer: Self-pay | Admitting: Internal Medicine

## 2023-11-07 NOTE — Telephone Encounter (Signed)
 Pt is returning a call to [facility] or she's okay to speak with her regular nurse as well but she missed a call from [place] and wanted a call back at our earliest convenience.  Please advise, Thanks.

## 2023-11-07 NOTE — Telephone Encounter (Signed)
 Called and left a voice message for patient to call office back.

## 2023-11-17 NOTE — Telephone Encounter (Signed)
 Have since spoken to patient in separate telephone note.

## 2023-11-17 NOTE — Telephone Encounter (Signed)
 Called and spoke with patient and informed them of [facility] recommendations. Patient expressed understanding and is currently abroad.

## 2023-11-24 ENCOUNTER — Ambulatory Visit (INDEPENDENT_AMBULATORY_CARE_PROVIDER_SITE_OTHER): Payer: Medicare PPO

## 2023-11-24 DIAGNOSIS — M810 Age-related osteoporosis without current pathological fracture: Secondary | ICD-10-CM

## 2023-11-28 DIAGNOSIS — H04123 Dry eye syndrome of bilateral lacrimal glands: Secondary | ICD-10-CM | POA: Diagnosis not present

## 2023-11-28 DIAGNOSIS — H2513 Age-related nuclear cataract, bilateral: Secondary | ICD-10-CM | POA: Diagnosis not present

## 2023-11-28 DIAGNOSIS — H524 Presbyopia: Secondary | ICD-10-CM | POA: Diagnosis not present

## 2023-12-31 ENCOUNTER — Ambulatory Visit (INDEPENDENT_AMBULATORY_CARE_PROVIDER_SITE_OTHER)

## 2023-12-31 VITALS — Ht 66.0 in | Wt 137.0 lb

## 2023-12-31 DIAGNOSIS — Z0000 Encounter for general adult medical examination without abnormal findings: Secondary | ICD-10-CM | POA: Diagnosis not present

## 2023-12-31 NOTE — Patient Instructions (Signed)
 Catherine [name] , Thank you for taking time to come for your Medicare Wellness Visit. I appreciate your ongoing commitment to your health goals. Please review the following plan we discussed and let me know if I can assist you in the future.   Referrals/Orders/Follow-Ups/Clinician Recommendations: It was nice to speak with you today.  Keep up the good work.  This is a list of the screening recommended for you and due dates:  Health Maintenance  Topic Date Due   COVID-19 Vaccine (7 - 2024-25 season) 05/11/2023   DEXA scan (bone density measurement)  03/05/2024   Flu Shot  04/09/2024   Medicare Annual Wellness Visit  12/30/2024   Mammogram  03/10/2025   Colon Cancer Screening  05/23/2031   DTaP/Tdap/Td vaccine (4 - Td or Tdap) 08/22/2031   Pneumonia Vaccine  Completed   Hepatitis C Screening  Completed   Zoster (Shingles) Vaccine  Completed   HPV Vaccine  Aged Out   Meningitis B Vaccine  Aged Out    Advanced directives: (Copy Requested) Please bring a copy of your health care power of attorney and living will to the office to be added to your chart at your convenience. You can mail to [facility] [address] [place], [place] [address] or email to [email or web address]  Next Medicare Annual Wellness Visit scheduled for next year: Yes

## 2023-12-31 NOTE — Progress Notes (Signed)
 Subjective:   Catherine [name] is a 73 y.o. who presents for a Medicare Wellness preventive visit.  Visit Complete: Virtual I connected with  Catherine [name] on 12/31/23 by a audio enabled telemedicine application and verified that I am speaking with the correct person using two identifiers.  Patient Location: Home  Provider Location: Home Office  I discussed the limitations of evaluation and management by telemedicine. The patient expressed understanding and agreed to proceed.  Vital Signs: Because this visit was a virtual/telehealth visit, some criteria may be missing or patient reported. Any vitals not documented were not able to be obtained and vitals that have been documented are patient reported.  VideoDeclined- This patient declined Librarian, academic. Therefore the visit was completed with audio only.  Persons Participating in Visit: Patient.  AWV Questionnaire: Yes: Patient Medicare AWV questionnaire was completed by the patient on 12/30/2023; I have confirmed that all information answered by patient is correct and no changes since this date.  Cardiac Risk Factors include: advanced age (>43men, >24 women);Other (see comment);dyslipidemia, Risk factor comments: Aortic atherosclerosis     Objective:    Today's Vitals   12/31/23 0822  Weight: 137 lb (62.1 kg)  Height: 5\' 6"  (1.676 m)   Body mass index is 22.11 kg/m.     12/31/2023    8:37 AM 08/26/2022    3:12 PM 08/22/2021    2:26 PM 06/27/2020   10:09 AM 11/29/2018    6:14 AM 11/03/2017    7:26 AM 10/30/2017    9:03 AM  Advanced Directives  Does Patient Have a Medical Advance Directive? Yes Yes Yes Yes Yes Yes Yes  Type of Estate agent of [place];Living will Healthcare Power of [place];Living will Living will Healthcare Power of [place];Living will Healthcare Power of [place];Living will Healthcare Power of [place];Living will Healthcare Power of [place];Living will   Does patient want to make changes to medical advance directive?   No - Patient declined No - Patient declined     Copy of Healthcare Power of Attorney in Chart? No - copy requested No - copy requested  No - copy requested No - copy requested No - copy requested No - copy requested  Would patient like information on creating a medical advance directive?     No - Patient declined      Current Medications (verified) Outpatient Encounter Medications as of 12/31/2023  Medication Sig   calcium gluconate 500 MG tablet Take 500 mg by mouth daily.   Cholecalciferol (VITAMIN D ) 1000 UNITS capsule Take 1,000 Units by mouth daily.   denosumab  (PROLIA ) 60 MG/ML SOLN injection Inject 60 mg into the skin every 6 (six) months. Administer in upper arm, thigh, or abdomen   glucosamine-chondroitin 500-400 MG tablet Take 1 tablet by mouth daily.   magnesium 30 MG tablet Take 30 mg by mouth 2 (two) times daily.   Multiple Vitamin (MULTIVITAMIN) capsule Take 1 capsule by mouth daily.   POTASSIUM PO Take by mouth.   scopolamine  (TRANSDERM-SCOP) 1 MG/3DAYS PLACE 1 PATCH BEHIND THE EAR 4 HOURS PRIOR TO EVENT FOR MOTION SICKNESS. REPLACE EVERY 3 DAYS OPPOSITE EAR   Turmeric 500 MG CAPS Take 500 mg by mouth daily.    Omega-3 Fatty Acids (FISH OIL) 1000 MG CAPS Take 1,000 mg by mouth daily.   No facility-administered encounter medications on file as of 12/31/2023.    Allergies (verified) Oysters [shellfish allergy], Avocado, and Latex   History: Past Medical History:  Diagnosis Date  Cancer (HCC)    basal cell back   Colon polyp 2017   Osteoporosis 10/2017   T score -2.9   Past Surgical History:  Procedure Laterality Date   COLONOSCOPY     COLONOSCOPY WITH PROPOFOL  N/A 06/17/2016   Procedure: COLONOSCOPY WITH PROPOFOL ;  Surgeon: Kenney Peacemaker, MD;  Location: [facility];  Service: Endoscopy;  Laterality: N/A;   COLONOSCOPY WITH PROPOFOL  N/A 11/03/2017   Procedure: COLONOSCOPY WITH PROPOFOL ;  Surgeon:  Kenney Peacemaker, MD;  Location: [facility];  Service: Endoscopy;  Laterality: N/A;   HOT HEMOSTASIS N/A 06/17/2016   Procedure: HOT HEMOSTASIS (ARGON PLASMA COAGULATION/BICAP);  Surgeon: Kenney Peacemaker, MD;  Location: [facility];  Service: Endoscopy;  Laterality: N/A;   HOT HEMOSTASIS N/A 11/03/2017   Procedure: HOT HEMOSTASIS (ARGON PLASMA COAGULATION/BICAP);  Surgeon: Kenney Peacemaker, MD;  Location: [facility];  Service: Endoscopy;  Laterality: N/A;   POLYPECTOMY     Family History  Problem Relation Age of Onset   Hypothyroidism Mother    Thyroid  disease Mother    Lung cancer Father        smoker   Thyroid  disease Sister    Thyroid  disease Sister    Thyroid  disease Sister    Kidney Stones Sister    Colon cancer Neg Hx    Colon polyps Neg Hx    Esophageal cancer Neg Hx    Rectal cancer Neg Hx    Stomach cancer Neg Hx    Social History   Socioeconomic History   Marital status: Married    Spouse name: Porfirio Bristol   Number of children: Not on file   Years of education: Not on file   Highest education level: Doctorate  Occupational History   Occupation: retired  Tobacco Use   Smoking status: Never   Smokeless tobacco: Never  Vaping Use   Vaping status: Never Used  Substance and Sexual Activity   Alcohol use: Yes    Comment: wine with dinner   Drug use: No   Sexual activity: Yes    Birth control/protection: Post-menopausal    Comment: 1st intercourse 73 yo-Fewer than 5 partners  Other Topics Concern   Not on file  Social History Narrative   Exercises regularly      Lives with husband/2025   Social Drivers of Health   Financial Resource Strain: Low Risk  (12/30/2023)   Overall Financial Resource Strain (CARDIA)    Difficulty of Paying Living Expenses: Not hard at all  Food Insecurity: No Food Insecurity (12/30/2023)   Hunger Vital Sign    Worried About Running Out of Food in the Last Year: Never true    Ran Out of Food in the Last Year: Never true  Transportation  Needs: No Transportation Needs (12/30/2023)   PRAPARE - Administrator, Civil Service (Medical): No    Lack of Transportation (Non-Medical): No  Physical Activity: Sufficiently Active (12/30/2023)   Exercise Vital Sign    Days of Exercise per Week: 5 days    Minutes of Exercise per Session: 50 min  Stress: No Stress Concern Present (12/30/2023)   [employer] of Occupational Health - Occupational Stress Questionnaire    Feeling of Stress : Not at all  Social Connections: Moderately Integrated (12/30/2023)   Social Connection and Isolation Panel [NHANES]    Frequency of Communication with Friends and Family: More than three times a week    Frequency of Social Gatherings with Friends and Family: Twice a week  Attends Religious Services: Never    Active Member of Clubs or Organizations: No    Attends Banker Meetings: 1 to 4 times per year    Marital Status: Married    Tobacco Counseling Counseling given: Not Answered    Clinical Intake:  Pre-visit preparation completed: Yes  Pain : No/denies pain     BMI - recorded: 22.11 Nutritional Status: BMI of 19-24  Normal Nutritional Risks: None Diabetes: No  No results found for: "HGBA1C"   How often do you need to have someone help you when you read instructions, pamphlets, or other written materials from your doctor or pharmacy?: 1 - Never  Interpreter Needed?: No  Information entered by :: Kevork Joyce, RMA   Activities of Daily Living     12/30/2023   10:57 AM  In your present state of health, do you have any difficulty performing the following activities:  Hearing? 0  Vision? 0  Difficulty concentrating or making decisions? 0  Walking or climbing stairs? 0  Dressing or bathing? 0  Doing errands, shopping? 0  Preparing Food and eating ? N  Using the Toilet? N  In the past six months, have you accidently leaked urine? N  Do you have problems with loss of bowel control? N  Managing your  Medications? N  Managing your Finances? N  Housekeeping or managing your Housekeeping? N    Patient Care Team: Colene Dauphin, MD as PCP - General (Internal Medicine) Kenney Peacemaker, MD as Consulting Physician (Gastroenterology) Cindra Cree, MD as Consulting Physician (Ophthalmology) Percy Bracken, MD as Consulting Physician (Obstetrics and Gynecology)  Indicate any recent Medical Services you may have received from other than [facility] providers in the past year (date may be approximate).     Assessment:   This is a routine wellness examination for Sonjia.  Hearing/Vision screen Hearing Screening - Comments:: Denies hearing difficulties   Vision Screening - Comments:: Wears eyeglasses   Goals Addressed             This Visit's Progress    patient   On track    My goal for 2024 is to continue to travel, enjoy life, and stay physically active because it motivates me.       Depression Screen     12/31/2023    8:37 AM 08/26/2022    3:21 PM 08/22/2021    2:28 PM 07/18/2021   12:42 PM 06/27/2020   10:07 AM 06/23/2019    8:27 AM 10/30/2017    9:04 AM  PHQ 2/9 Scores  PHQ - 2 Score 0 0 0 0 0 0 0  PHQ- 9 Score 0   0   0    Fall Risk     12/30/2023   10:57 AM 03/10/2023    3:29 PM 08/26/2022    3:14 PM 08/22/2021    2:28 PM 07/18/2021    8:24 AM  Fall Risk   Falls in the past year? 0 0 0 0 0  Number falls in past yr:  0 0 0 0  Injury with Fall? 0 0 0 0 0  Risk for fall due to :  No Fall Risks No Fall Risks No Fall Risks No Fall Risks  Follow up  Falls evaluation completed Falls prevention discussed Falls prevention discussed Falls evaluation completed    MEDICARE RISK AT HOME:  Medicare Risk at Home Any stairs in or around the home?: (Patient-Rptd) Yes If so, are there any without handrails?: (Patient-Rptd)  No Home free of loose throw rugs in walkways, pet beds, electrical cords, etc?: (Patient-Rptd) Yes Adequate lighting in your home to reduce risk of falls?:  (Patient-Rptd) Yes Life alert?: (Patient-Rptd) No Use of a cane, walker or w/c?: (Patient-Rptd) No Grab bars in the bathroom?: (Patient-Rptd) Yes Shower chair or bench in shower?: (Patient-Rptd) No Elevated toilet seat or a handicapped toilet?: (Patient-Rptd) Yes  TIMED UP AND GO:  Was the test performed?  No  Cognitive Function: Declined: Patient declined cognitive screening, but was able to answer questions in an accurate and timely manner. No cognitive impairments observed.    10/30/2017    9:05 AM  MMSE - Mini Mental State Exam  Not completed: Refused        08/26/2022    3:35 PM  6CIT Screen  What Year? 0 points  What month? 0 points  What time? 0 points  Count back from 20 0 points  Months in reverse 0 points  Repeat phrase 0 points  Total Score 0 points    Immunizations Immunization History  Administered Date(s) Administered   Fluad Quad(high Dose 65+) 06/23/2019, 06/03/2022   Hepatitis A 04/23/2006, 11/07/2006   Hepatitis B 04/21/2013, 05/25/2013, 08/19/2013   Influenza Split 07/03/2012, 06/11/2013   Influenza Whole 06/30/2017   Influenza, High Dose Seasonal PF 05/30/2016, 06/30/2018   Influenza-Unspecified 06/16/2014, 06/19/2015, 05/29/2020, 06/19/2021   MenQuadfi_Meningococcal Groups ACYW Conjugate 08/21/2021   PFIZER Comirnaty(Gray Top)Covid-19 Tri-Sucrose Vaccine 06/03/2022   PFIZER(Purple Top)SARS-COV-2 Vaccination 10/14/2019, 11/04/2019, 06/29/2020, 01/05/2021   PNEUMOCOCCAL CONJUGATE-20 08/16/2022   PPD Test 05/28/2016   Pfizer Covid-19 Vaccine Bivalent Booster 87yrs & up 06/15/2021   Pneumococcal Conjugate-13 08/16/2015   Pneumococcal Polysaccharide-23 10/03/2016   Td 05/29/2005, 08/21/2021   Tdap 08/21/2021   Typhoid Live 08/21/2021   Yellow Fever 08/21/2021   Zoster Recombinant(Shingrix) 01/24/2017, 05/14/2017   Zoster, Live 11/21/2014    Screening Tests Health Maintenance  Topic Date Due   COVID-19 Vaccine (7 - 2024-25 season) 05/11/2023    Medicare Annual Wellness (AWV)  08/27/2023   DEXA SCAN  03/05/2024   INFLUENZA VACCINE  04/09/2024   MAMMOGRAM  03/10/2025   Colonoscopy  05/23/2031   DTaP/Tdap/Td (4 - Td or Tdap) 08/22/2031   Pneumonia Vaccine 62+ Years old  Completed   Hepatitis C Screening  Completed   Zoster Vaccines- Shingrix  Completed   HPV VACCINES  Aged Out   Meningococcal B Vaccine  Aged Out    Health Maintenance  Health Maintenance Due  Topic Date Due   COVID-19 Vaccine (7 - 2024-25 season) 05/11/2023   Medicare Annual Wellness (AWV)  08/27/2023   Health Maintenance Items Addressed: See Nurse Notes  Additional Screening:  Vision Screening: Recommended annual ophthalmology exams for early detection of glaucoma and other disorders of the eye.  Dental Screening: Recommended annual dental exams for proper oral hygiene  Community Resource Referral / Chronic Care Management: CRR required this visit?  No   CCM required this visit?  No     Plan:     I have personally reviewed and noted the following in the patient's chart:   Medical and social history Use of alcohol, tobacco or illicit drugs  Current medications and supplements including opioid prescriptions. Patient is not currently taking opioid prescriptions. Functional ability and status Nutritional status Physical activity Advanced directives List of other physicians Hospitalizations, surgeries, and ER visits in previous 12 months Vitals Screenings to include cognitive, depression, and falls Referrals and appointments  In addition, I have reviewed and  discussed with patient certain preventive protocols, quality metrics, and best practice recommendations. A written personalized care plan for preventive services as well as general preventive health recommendations were provided to patient.     Claudell Wohler L Evaleigh Mccamy, CMA   12/31/2023   After Visit Summary: (MyChart) Due to this being a telephonic visit, the after visit summary with  patients personalized plan was offered to patient via MyChart   Notes: Nothing significant to report at this time.

## 2024-04-05 ENCOUNTER — Encounter: Payer: Self-pay | Admitting: Obstetrics and Gynecology

## 2024-04-05 ENCOUNTER — Ambulatory Visit: Admitting: Obstetrics and Gynecology

## 2024-04-05 VITALS — BP 108/68 | HR 72 | Temp 98.0°F | Ht 67.0 in | Wt 125.0 lb

## 2024-04-05 DIAGNOSIS — M810 Age-related osteoporosis without current pathological fracture: Secondary | ICD-10-CM

## 2024-04-05 DIAGNOSIS — Z1331 Encounter for screening for depression: Secondary | ICD-10-CM

## 2024-04-05 DIAGNOSIS — M8588 Other specified disorders of bone density and structure, other site: Secondary | ICD-10-CM | POA: Diagnosis not present

## 2024-04-05 DIAGNOSIS — Z1231 Encounter for screening mammogram for malignant neoplasm of breast: Secondary | ICD-10-CM | POA: Diagnosis not present

## 2024-04-05 DIAGNOSIS — Z01419 Encounter for gynecological examination (general) (routine) without abnormal findings: Secondary | ICD-10-CM | POA: Insufficient documentation

## 2024-04-05 LAB — HM DEXA SCAN

## 2024-04-05 LAB — HM MAMMOGRAPHY

## 2024-04-05 NOTE — Assessment & Plan Note (Addendum)
 Osteoporosis treatment: Actonel 2002-2013.  Started Prolia  in 2017. Continue for 42yr. Prolia  60mg  SQ injection (upper arm, thigh, or the abdomen) q6 months for up to 10y.  Normal calcium and vitamin D  with October 2024 labs.   Ensure dental checks, calcium and vitamin D  supplementation. DXA q2y.  Plan to transition to bisphosphonate 6 month after last dose.

## 2024-04-05 NOTE — Patient Instructions (Signed)

## 2024-04-05 NOTE — Progress Notes (Signed)
 73 y.o. G0P0 postmenopausal female with osteopenia on Prolia  here for annual exam. Married.  She reports overall doing well. Osteoporosis treatment: Actonel 2002-2013.  Started Prolia  in 2017. DXA scheduled for today. Urine sample provided: no  Postmenopausal bleeding: none Pelvic discharge or pain: none Breast mass, nipple discharge or skin changes : none  Last PAP:     Component Value Date/Time   DIAGPAP  03/10/2023 1604    - Negative for intraepithelial lesion or malignancy (NILM)   DIAGPAP  01/15/2021 1054    - Negative for intraepithelial lesion or malignancy (NILM)   ADEQPAP  03/10/2023 1604    Satisfactory for evaluation; transformation zone component PRESENT.   ADEQPAP  01/15/2021 1054    Satisfactory for evaluation; transformation zone component PRESENT.   Last mammogram: 03/11/23  BIRADS 1, density c Last DXA:  03/05/22, osteopenia, T-score -2.2 Last colonoscopy: 05/22/21  Exercising: walking Smoker:no  Flowsheet Row Office Visit from 04/05/2024 in [facility]  PHQ-2 Total Score 0    Flowsheet Row Clinical Support from 12/31/2023 in [facility]  PHQ-9 Total Score 0     GYN HISTORY: None  OB History  Gravida Para Term Preterm AB Living  0       SAB IAB Ectopic Multiple Live Births         Past Medical History:  Diagnosis Date   Cancer (HCC)    basal cell back   Colon polyp 2017   Osteoporosis 10/2017   T score -2.9   Past Surgical History:  Procedure Laterality Date   COLONOSCOPY     COLONOSCOPY WITH PROPOFOL  N/A 06/17/2016   Procedure: COLONOSCOPY WITH PROPOFOL ;  Surgeon: Lupita FORBES Commander, MD;  Location: [facility];  Service: Endoscopy;  Laterality: N/A;   COLONOSCOPY WITH PROPOFOL  N/A 11/03/2017   Procedure: COLONOSCOPY WITH PROPOFOL ;  Surgeon: Commander Lupita FORBES, MD;  Location: [facility];  Service: Endoscopy;  Laterality: N/A;   HOT HEMOSTASIS N/A 06/17/2016   Procedure: HOT  HEMOSTASIS (ARGON PLASMA COAGULATION/BICAP);  Surgeon: Lupita FORBES Commander, MD;  Location: [facility];  Service: Endoscopy;  Laterality: N/A;   HOT HEMOSTASIS N/A 11/03/2017   Procedure: HOT HEMOSTASIS (ARGON PLASMA COAGULATION/BICAP);  Surgeon: Commander Lupita FORBES, MD;  Location: [facility];  Service: Endoscopy;  Laterality: N/A;   POLYPECTOMY     Current Outpatient Medications on File Prior to Visit  Medication Sig Dispense Refill   calcium gluconate 500 MG tablet Take 500 mg by mouth daily.     CALCIUM PO as directed Orally     Cholecalciferol (VITAMIN D ) 1000 UNITS capsule Take 1,000 Units by mouth daily.     denosumab  (PROLIA ) 60 MG/ML SOLN injection Inject 60 mg into the skin every 6 (six) months. Administer in upper arm, thigh, or abdomen     glucosamine-chondroitin 500-400 MG tablet Take 1 tablet by mouth daily.     magnesium 30 MG tablet Take 30 mg by mouth 2 (two) times daily.     Multiple Vitamin (MULTIVITAMIN) capsule Take 1 capsule by mouth daily.     Omega-3 Fatty Acids (FISH OIL) 1000 MG CAPS Take 1,000 mg by mouth daily.     POTASSIUM PO Take by mouth.     No current facility-administered medications on file prior to visit.   Social History   Socioeconomic History   Marital status: Married    Spouse name: Lamar   Number of children: Not on file   Years of  education: Not on file   Highest education level: Doctorate  Occupational History   Occupation: retired  Tobacco Use   Smoking status: Never   Smokeless tobacco: Never  Vaping Use   Vaping status: Never Used  Substance and Sexual Activity   Alcohol use: Yes    Comment: wine with dinner   Drug use: No   Sexual activity: Yes    Birth control/protection: Post-menopausal    Comment: 1st intercourse 73 yo-Fewer than 5 partners  Other Topics Concern   Not on file  Social History Narrative   Exercises regularly      Lives with husband/2025   Social Drivers of Health   Financial Resource Strain: Low Risk   (12/30/2023)   Overall Financial Resource Strain (CARDIA)    Difficulty of Paying Living Expenses: Not hard at all  Food Insecurity: No Food Insecurity (12/30/2023)   Hunger Vital Sign    Worried About Running Out of Food in the Last Year: Never true    Ran Out of Food in the Last Year: Never true  Transportation Needs: No Transportation Needs (12/30/2023)   PRAPARE - Administrator, Civil Service (Medical): No    Lack of Transportation (Non-Medical): No  Physical Activity: Sufficiently Active (12/30/2023)   Exercise Vital Sign    Days of Exercise per Week: 5 days    Minutes of Exercise per Session: 50 min  Stress: No Stress Concern Present (12/30/2023)   [employer] of Occupational Health - Occupational Stress Questionnaire    Feeling of Stress : Not at all  Social Connections: Moderately Integrated (12/30/2023)   Social Connection and Isolation Panel    Frequency of Communication with Friends and Family: More than three times a week    Frequency of Social Gatherings with Friends and Family: Twice a week    Attends Religious Services: Never    Database administrator or Organizations: No    Attends Engineer, structural: 1 to 4 times per year    Marital Status: Married  Catering manager Violence: Not At Risk (12/31/2023)   Humiliation, Afraid, Rape, and Kick questionnaire    Fear of Current or Ex-Partner: No    Emotionally Abused: No    Physically Abused: No    Sexually Abused: No   Family History  Problem Relation Age of Onset   Hypothyroidism Mother    Thyroid  disease Mother    Lung cancer Father        smoker   Thyroid  disease Sister    Thyroid  disease Sister    Thyroid  disease Sister    Kidney Stones Sister    Colon cancer Neg Hx    Colon polyps Neg Hx    Esophageal cancer Neg Hx    Rectal cancer Neg Hx    Stomach cancer Neg Hx    Allergies  Allergen Reactions   Oysters [Shellfish Allergy] Nausea And Vomiting and Other (See Comments)    BUT  EATS SHRIMP AND NO ALLERGY TO IODINE   Avocado Nausea And Vomiting   Latex Rash      PE Today's Vitals   04/05/24 0811  BP: 108/68  Pulse: 72  Temp: 98 F (36.7 C)  TempSrc: Oral  SpO2: 98%  Weight: 125 lb (56.7 kg)  Height: 5' 7 (1.702 m)   Body mass index is 19.58 kg/m.  Physical Exam Vitals reviewed. Exam conducted with a chaperone present.  Constitutional:      General: She is not in acute  distress.    Appearance: Normal appearance.  HENT:     Head: Normocephalic and atraumatic.     Nose: Nose normal.  Eyes:     Extraocular Movements: Extraocular movements intact.     Conjunctiva/sclera: Conjunctivae normal.  Neck:     Thyroid : No thyroid  mass, thyromegaly or thyroid  tenderness.  Pulmonary:     Effort: Pulmonary effort is normal.  Chest:     Chest wall: No mass or tenderness.  Breasts:    Right: Normal. No swelling, mass, nipple discharge, skin change or tenderness.     Left: Normal. No swelling, mass, nipple discharge, skin change or tenderness.  Abdominal:     General: There is no distension.     Palpations: Abdomen is soft.     Tenderness: There is no abdominal tenderness.  Genitourinary:    General: Normal vulva.     Exam position: Lithotomy position.     Urethra: No prolapse.     Vagina: Normal. No vaginal discharge or bleeding.     Cervix: Normal. No lesion.     Uterus: Normal. Not enlarged and not tender.      Adnexa: Right adnexa normal and left adnexa normal.  Musculoskeletal:        General: Normal range of motion.     Cervical back: Normal range of motion.  Lymphadenopathy:     Upper Body:     Right upper body: No axillary adenopathy.     Left upper body: No axillary adenopathy.     Lower Body: No right inguinal adenopathy. No left inguinal adenopathy.  Skin:    General: Skin is warm and dry.  Neurological:     General: No focal deficit present.     Mental Status: She is alert.  Psychiatric:        Mood and Affect: Mood normal.         Behavior: Behavior normal.       Assessment and Plan:        Encounter for breast and pelvic examination Assessment & Plan: Cervical cancer screening: patient desires ongoing surveillance Encouraged annual mammogram screening Colonoscopy UTD DXA due Labs and immunizations with her primary Encouraged safe sexual practices as indicated Encouraged healthy lifestyle practices with diet and exercise For patients over 70yo, I recommend 1200mg  calcium daily and 800IU of vitamin D  daily.    Age-related osteoporosis without current pathological fracture Assessment & Plan: Osteoporosis treatment: Actonel 2002-2013.  Started Prolia  in 2017. Continue for 56yr. Prolia  60mg  SQ injection (upper arm, thigh, or the abdomen) q6 months for up to 10y.  Normal calcium and vitamin D  with October 2024 labs.   Ensure dental checks, calcium and vitamin D  supplementation. DXA q2y.  Plan to transition to bisphosphonate 6 month after last dose.      Vera LULLA Pa, MD

## 2024-04-05 NOTE — Assessment & Plan Note (Signed)
 Cervical cancer screening: patient desires ongoing surveillance Encouraged annual mammogram screening Colonoscopy UTD DXA due Labs and immunizations with her primary Encouraged safe sexual practices as indicated Encouraged healthy lifestyle practices with diet and exercise For patients over 74yo, I recommend 1200mg  calcium daily and 800IU of vitamin D  daily.

## 2024-04-06 ENCOUNTER — Ambulatory Visit (HOSPITAL_BASED_OUTPATIENT_CLINIC_OR_DEPARTMENT_OTHER): Payer: Self-pay | Admitting: Obstetrics and Gynecology

## 2024-04-06 DIAGNOSIS — M25561 Pain in right knee: Secondary | ICD-10-CM | POA: Diagnosis not present

## 2024-05-04 ENCOUNTER — Other Ambulatory Visit: Payer: Self-pay | Admitting: Internal Medicine

## 2024-05-05 ENCOUNTER — Other Ambulatory Visit: Payer: Self-pay | Admitting: *Deleted

## 2024-05-05 DIAGNOSIS — M810 Age-related osteoporosis without current pathological fracture: Secondary | ICD-10-CM

## 2024-05-05 MED ORDER — DENOSUMAB 60 MG/ML ~~LOC~~ SOSY
60.0000 mg | PREFILLED_SYRINGE | SUBCUTANEOUS | Status: AC
  Administered 2024-07-09: 60 mg via SUBCUTANEOUS

## 2024-05-07 DIAGNOSIS — M1711 Unilateral primary osteoarthritis, right knee: Secondary | ICD-10-CM | POA: Diagnosis not present

## 2024-05-31 ENCOUNTER — Telehealth: Payer: Self-pay | Admitting: *Deleted

## 2024-05-31 NOTE — Telephone Encounter (Signed)
 Patient left message on triage line for [facility]. States she will be out of the country, returning 06/23/24. Could schedule Prolia  PM of 06/23/24. Requesting return call.

## 2024-06-01 NOTE — Telephone Encounter (Signed)
 Patient called triage line stating that she is going out of the country soon & can do her prolia  when she gets back. She states she can schedule an appt for oct 15th in the afternoon or on oct 17th in the morning. Patient is aware that Damien is out of the office this week & will return next week.

## 2024-06-02 ENCOUNTER — Other Ambulatory Visit: Payer: Self-pay | Admitting: *Deleted

## 2024-06-02 DIAGNOSIS — M810 Age-related osteoporosis without current pathological fracture: Secondary | ICD-10-CM

## 2024-06-02 NOTE — Telephone Encounter (Signed)
 Visit on 10/15 changed to lab visit. Order placed for calcium. Patient advised once labs back and normal could schedule prolia  injection. Patient agreeable.   Encounter closed.

## 2024-06-02 NOTE — Telephone Encounter (Signed)
 Call to patient. Patient states she is traveling out of the country soon and will not be back until mid October. Patient asking to schedule Prolia  for 06/23/24. Last calcium level was 9.5 on 06/17/23. Patient states she does labs with Dr. Geofm and not due to see her until 07/27/24. Please advise if patient needs labs prior to Prolia  or if okay to use labs from 06-17-23?  Routing to provider for review.

## 2024-06-17 ENCOUNTER — Encounter: Payer: Medicare PPO | Admitting: Internal Medicine

## 2024-06-23 ENCOUNTER — Other Ambulatory Visit

## 2024-06-23 ENCOUNTER — Ambulatory Visit

## 2024-06-23 DIAGNOSIS — D2261 Melanocytic nevi of right upper limb, including shoulder: Secondary | ICD-10-CM | POA: Diagnosis not present

## 2024-06-23 DIAGNOSIS — N6459 Other signs and symptoms in breast: Secondary | ICD-10-CM | POA: Diagnosis not present

## 2024-06-23 DIAGNOSIS — D225 Melanocytic nevi of trunk: Secondary | ICD-10-CM | POA: Diagnosis not present

## 2024-06-23 DIAGNOSIS — L821 Other seborrheic keratosis: Secondary | ICD-10-CM | POA: Diagnosis not present

## 2024-06-23 DIAGNOSIS — M810 Age-related osteoporosis without current pathological fracture: Secondary | ICD-10-CM

## 2024-06-23 DIAGNOSIS — Z85828 Personal history of other malignant neoplasm of skin: Secondary | ICD-10-CM | POA: Diagnosis not present

## 2024-06-23 DIAGNOSIS — D2271 Melanocytic nevi of right lower limb, including hip: Secondary | ICD-10-CM | POA: Diagnosis not present

## 2024-06-23 DIAGNOSIS — L578 Other skin changes due to chronic exposure to nonionizing radiation: Secondary | ICD-10-CM | POA: Diagnosis not present

## 2024-06-23 DIAGNOSIS — L814 Other melanin hyperpigmentation: Secondary | ICD-10-CM | POA: Diagnosis not present

## 2024-06-23 DIAGNOSIS — Z86018 Personal history of other benign neoplasm: Secondary | ICD-10-CM | POA: Diagnosis not present

## 2024-06-24 ENCOUNTER — Ambulatory Visit: Payer: Self-pay | Admitting: Obstetrics and Gynecology

## 2024-06-24 LAB — CALCIUM: Calcium: 8.8 mg/dL (ref 8.6–10.4)

## 2024-06-25 DIAGNOSIS — M25561 Pain in right knee: Secondary | ICD-10-CM | POA: Diagnosis not present

## 2024-06-25 DIAGNOSIS — M1711 Unilateral primary osteoarthritis, right knee: Secondary | ICD-10-CM | POA: Diagnosis not present

## 2024-07-02 DIAGNOSIS — M1711 Unilateral primary osteoarthritis, right knee: Secondary | ICD-10-CM | POA: Diagnosis not present

## 2024-07-09 ENCOUNTER — Ambulatory Visit

## 2024-07-09 DIAGNOSIS — M25561 Pain in right knee: Secondary | ICD-10-CM | POA: Diagnosis not present

## 2024-07-09 DIAGNOSIS — M1711 Unilateral primary osteoarthritis, right knee: Secondary | ICD-10-CM | POA: Diagnosis not present

## 2024-07-09 DIAGNOSIS — M810 Age-related osteoporosis without current pathological fracture: Secondary | ICD-10-CM

## 2024-07-09 NOTE — Progress Notes (Signed)
 Pt came in for her 7th prolia  shot, it was given in the right SQ arm. Pt tolerated it well.

## 2024-07-26 ENCOUNTER — Encounter: Payer: Self-pay | Admitting: Internal Medicine

## 2024-07-26 NOTE — Patient Instructions (Addendum)

## 2024-07-26 NOTE — Progress Notes (Unsigned)
 Subjective:    Patient ID: Catherine [name], female    DOB: 11/27/1950, 73 y.o.   MRN: [id number]      HPI Catherine [name] is here for a Physical exam and her chronic medical problems.   Lost 10 lbs in the past year.  She has been traveling a lot.  Achieved going to 75 countries.  She lost the 10 lbs while at vacation in one country because of the diet.  She is gaining back some weight.   Slight tear in R medial meniscus.  Did steroid injections.  Has had some gel shots x3  Q 6 months.   Two stresses - renovating her house and mother who is 1 - has prolapsed uterus - it is starting to bleed   Medications and allergies reviewed with patient and updated if appropriate.  Current Outpatient Medications on File Prior to Visit  Medication Sig Dispense Refill  . calcium gluconate 500 MG tablet Take 500 mg by mouth daily.    SABRA CALCIUM PO as directed Orally    . Cholecalciferol (VITAMIN D ) 1000 UNITS capsule Take 1,000 Units by mouth daily.    . denosumab  (PROLIA ) 60 MG/ML SOLN injection Inject 60 mg into the skin every 6 (six) months. Administer in upper arm, thigh, or abdomen    . glucosamine-chondroitin 500-400 MG tablet Take 1 tablet by mouth daily.    . magnesium 30 MG tablet Take 30 mg by mouth 2 (two) times daily.    . Multiple Vitamin (MULTIVITAMIN) capsule Take 1 capsule by mouth daily.    . Omega-3 Fatty Acids (FISH OIL) 1000 MG CAPS Take 1,000 mg by mouth daily.    SABRA POTASSIUM PO Take by mouth.    . scopolamine  (TRANSDERM-SCOP) 1 MG/3DAYS PLACE 1 PATCH BEHIND THE EAR 4 HOURS PRIOR TO EVENT FOR MOTION SICKNESS. REPLACE EVERY 3 DAYS OPPOSITE EAR 10 patch 0   No current facility-administered medications on file prior to visit.    Review of Systems  Constitutional:  Negative for fever.  Eyes:  Negative for visual disturbance.  Respiratory:  Negative for cough, shortness of breath and wheezing.   Cardiovascular:  Negative for chest pain, palpitations and leg swelling.  Gastrointestinal:   Negative for abdominal pain, blood in stool, constipation and diarrhea.       No gerd  Genitourinary:  Negative for dysuria.  Musculoskeletal:  Positive for arthralgias (right medial meniscus tear). Negative for back pain.  Skin:  Negative for rash.  Neurological:  Negative for light-headedness and headaches.  Psychiatric/Behavioral:  Negative for dysphoric mood. The patient is not nervous/anxious.        Objective:   Vitals:   07/27/24 1427  BP: 118/74  Pulse: 72  Temp: 98.8 F (37.1 C)  SpO2: 94%   Filed Weights   07/27/24 1427  Weight: 124 lb (56.2 kg)   Body mass index is 19.42 kg/m.  BP Readings from Last 3 Encounters:  07/27/24 118/74  04/05/24 108/68  06/17/23 108/72    Wt Readings from Last 3 Encounters:  07/27/24 124 lb (56.2 kg)  04/05/24 125 lb (56.7 kg)  12/31/23 137 lb (62.1 kg)       Physical Exam Constitutional: She appears well-developed and well-nourished. No distress.  HENT:  Head: Normocephalic and atraumatic.  Right Ear: External ear normal. Normal ear canal and TM Left Ear: External ear normal.  Normal ear canal and TM Mouth/Throat: Oropharynx is clear and moist.  Eyes: Conjunctivae normal.  Neck: Neck supple.  No tracheal deviation present. No thyromegaly present.  No carotid bruit  Cardiovascular: Normal rate, regular rhythm and normal heart sounds.   No murmur heard.  No edema. Pulmonary/Chest: Effort normal and breath sounds normal. No respiratory distress. She has no wheezes. She has no rales.  Breast: deferred   Abdominal: Soft. She exhibits no distension. There is no tenderness.  Lymphadenopathy: She has no cervical adenopathy.  Skin: Skin is warm and dry. She is not diaphoretic.  Psychiatric: She has a normal mood and affect. Her behavior is normal.     Lab Results  Component Value Date   WBC 5.4 06/17/2023   HGB 14.3 06/17/2023   HCT 44.1 06/17/2023   PLT 291.0 06/17/2023   GLUCOSE 91 06/17/2023   CHOL 228 (H)  06/17/2023   TRIG 54.0 06/17/2023   HDL 92.70 06/17/2023   LDLDIRECT 148.0 07/03/2012   LDLCALC 125 (H) 06/17/2023   ALT 15 06/17/2023   AST 23 06/17/2023   NA 142 06/17/2023   K 4.6 06/17/2023   CL 107 06/17/2023   CREATININE 0.81 06/17/2023   BUN 14 06/17/2023   CO2 20 06/17/2023   TSH 3.10 06/17/2023   The 10-year ASCVD risk score (Arnett DK, et al., 2019) is: 12.5%   Values used to calculate the score:     Age: 34 years     Clincally relevant sex: Female     Is Non-Hispanic African American: No     Diabetic: No     Tobacco smoker: No     Systolic Blood Pressure: 118 mmHg     Is BP treated: No     HDL Cholesterol: 92.7 mg/dL     Total Cholesterol: 228 mg/dL       Assessment & Plan:   Physical exam: Screening blood work  ordered Exercise  regular Weight  normal Substance abuse  none - drinks socially Eye exam up to date Sees Dermatology annually  Reviewed recommended immunizations.   Health Maintenance  Topic Date Due  . Influenza Vaccine  04/09/2024  . COVID-19 Vaccine (7 - 2025-26 season) 05/10/2024  . Medicare Annual Wellness (AWV)  12/30/2024  . Mammogram  04/05/2026  . DEXA SCAN  04/05/2026  . Colonoscopy  05/23/2031  . DTaP/Tdap/Td (4 - Td or Tdap) 08/22/2031  . Pneumococcal Vaccine: 50+ Years  Completed  . Hepatitis C Screening  Completed  . Zoster Vaccines- Shingrix  Completed  . Meningococcal B Vaccine  Aged Out  . Hepatitis B Vaccines 19-59 Average Risk  Discontinued          See Problem List for Assessment and Plan of chronic medical problems.

## 2024-07-27 ENCOUNTER — Ambulatory Visit (INDEPENDENT_AMBULATORY_CARE_PROVIDER_SITE_OTHER): Admitting: Internal Medicine

## 2024-07-27 VITALS — BP 118/74 | HR 72 | Temp 98.8°F | Ht 67.0 in | Wt 124.0 lb

## 2024-07-27 DIAGNOSIS — R7689 Other specified abnormal immunological findings in serum: Secondary | ICD-10-CM

## 2024-07-27 DIAGNOSIS — M810 Age-related osteoporosis without current pathological fracture: Secondary | ICD-10-CM

## 2024-07-27 DIAGNOSIS — E7800 Pure hypercholesterolemia, unspecified: Secondary | ICD-10-CM

## 2024-07-27 DIAGNOSIS — Z0000 Encounter for general adult medical examination without abnormal findings: Secondary | ICD-10-CM | POA: Diagnosis not present

## 2024-07-27 DIAGNOSIS — Z136 Encounter for screening for cardiovascular disorders: Secondary | ICD-10-CM

## 2024-07-27 DIAGNOSIS — I700 Atherosclerosis of aorta: Secondary | ICD-10-CM | POA: Diagnosis not present

## 2024-07-27 NOTE — Assessment & Plan Note (Signed)
 Chronic Mild aortic atherosclerosis Check lipid panel, CMP, TSH, CMP Discussed evaluating risk further-Will get CT CAC given elevated ASCVD risk Regular exercise and healthy diet encouraged

## 2024-07-27 NOTE — Assessment & Plan Note (Signed)
 Chronic Mild Lab Results  Component Value Date   LDLCALC 125 (H) 06/17/2023   LDL slightly above goal ASCVD risk is 12.5% Will get CT CAC to evaluate risk further Continue healthy diet, regular exercise Lipid panel, CBC, CMP, TSH

## 2024-07-27 NOTE — Assessment & Plan Note (Signed)
Chronic Doing prolia via gyn dexa via gyn Continue regular exercise continue calcium and vitamin d daily Will check vitamin D level, CBC, CMP, TSH

## 2024-07-27 NOTE — Assessment & Plan Note (Signed)
Chronic  Clinically euthyroid Check tsh    

## 2024-07-28 ENCOUNTER — Ambulatory Visit: Payer: Self-pay | Admitting: Internal Medicine

## 2024-07-28 ENCOUNTER — Other Ambulatory Visit (INDEPENDENT_AMBULATORY_CARE_PROVIDER_SITE_OTHER)

## 2024-07-28 DIAGNOSIS — M810 Age-related osteoporosis without current pathological fracture: Secondary | ICD-10-CM | POA: Diagnosis not present

## 2024-07-28 DIAGNOSIS — E7800 Pure hypercholesterolemia, unspecified: Secondary | ICD-10-CM

## 2024-07-28 DIAGNOSIS — R7689 Other specified abnormal immunological findings in serum: Secondary | ICD-10-CM

## 2024-07-28 LAB — CBC
HCT: 40 % (ref 36.0–46.0)
Hemoglobin: 13.3 g/dL (ref 12.0–15.0)
MCHC: 33.3 g/dL (ref 30.0–36.0)
MCV: 99.3 fl (ref 78.0–100.0)
Platelets: 276 K/uL (ref 150.0–400.0)
RBC: 4.03 Mil/uL (ref 3.87–5.11)
RDW: 14.3 % (ref 11.5–15.5)
WBC: 5.7 K/uL (ref 4.0–10.5)

## 2024-07-28 LAB — COMPREHENSIVE METABOLIC PANEL WITH GFR
ALT: 14 U/L (ref 0–35)
AST: 21 U/L (ref 0–37)
Albumin: 4.1 g/dL (ref 3.5–5.2)
Alkaline Phosphatase: 46 U/L (ref 39–117)
BUN: 12 mg/dL (ref 6–23)
CO2: 28 meq/L (ref 19–32)
Calcium: 8.7 mg/dL (ref 8.4–10.5)
Chloride: 105 meq/L (ref 96–112)
Creatinine, Ser: 0.69 mg/dL (ref 0.40–1.20)
GFR: 85.26 mL/min (ref >60.00)
Glucose, Bld: 83 mg/dL (ref 70–99)
Potassium: 4 meq/L (ref 3.5–5.1)
Sodium: 140 meq/L (ref 135–145)
Total Bilirubin: 0.6 mg/dL (ref 0.2–1.2)
Total Protein: 6.8 g/dL (ref 6.0–8.3)

## 2024-07-28 LAB — LIPID PANEL
Cholesterol: 211 mg/dL — ABNORMAL HIGH (ref 0–200)
HDL: 88.3 mg/dL (ref >39.00)
LDL Cholesterol: 114 mg/dL — ABNORMAL HIGH (ref 0–99)
NonHDL: 122.97
Total CHOL/HDL Ratio: 2
Triglycerides: 46 mg/dL (ref 0.0–149.0)
VLDL: 9.2 mg/dL (ref 0.0–40.0)

## 2024-07-28 LAB — TSH: TSH: 3.25 u[IU]/mL (ref 0.35–5.50)

## 2024-07-28 LAB — VITAMIN D 25 HYDROXY (VIT D DEFICIENCY, FRACTURES): VITD: 40.57 ng/mL (ref 30.00–100.00)

## 2024-08-02 ENCOUNTER — Ambulatory Visit (HOSPITAL_COMMUNITY)
Admission: RE | Admit: 2024-08-02 | Discharge: 2024-08-02 | Disposition: A | Discharge status: Home / Self Care | Payer: Self-pay | Source: Ambulatory Visit | Attending: Cardiovascular Disease | Admitting: Cardiovascular Disease

## 2024-08-02 DIAGNOSIS — Z136 Encounter for screening for cardiovascular disorders: Secondary | ICD-10-CM | POA: Insufficient documentation

## 2024-08-08 MED ORDER — ROSUVASTATIN CALCIUM 5 MG PO TABS: 5.0000 mg | ORAL_TABLET | ORAL | 3 refills | Status: AC

## 2024-08-08 NOTE — Addendum Note (Signed)
 Addended by: GEOFM GLADE PARAS on: 08/08/2024 03:56 PM   Modules accepted: Orders

## 2024-10-01 ENCOUNTER — Other Ambulatory Visit

## 2024-10-01 DIAGNOSIS — E7800 Pure hypercholesterolemia, unspecified: Secondary | ICD-10-CM

## 2024-10-01 LAB — LIPID PANEL
Cholesterol: 154 mg/dL (ref 28–200)
HDL: 67.5 mg/dL
LDL Cholesterol: 75 mg/dL (ref 10–99)
NonHDL: 86.1
Total CHOL/HDL Ratio: 2
Triglycerides: 56 mg/dL (ref 10.0–149.0)
VLDL: 11.2 mg/dL (ref 0.0–40.0)

## 2024-10-01 LAB — HEPATIC FUNCTION PANEL
ALT: 22 U/L (ref 3–35)
AST: 24 U/L (ref 5–37)
Albumin: 4.2 g/dL (ref 3.5–5.2)
Alkaline Phosphatase: 49 U/L (ref 39–117)
Bilirubin, Direct: 0.1 mg/dL (ref 0.1–0.3)
Total Bilirubin: 0.7 mg/dL (ref 0.2–1.2)
Total Protein: 7.3 g/dL (ref 6.0–8.3)

## 2024-10-02 ENCOUNTER — Ambulatory Visit: Payer: Self-pay | Admitting: Internal Medicine

## 2024-12-31 ENCOUNTER — Ambulatory Visit

## 2025-08-02 ENCOUNTER — Encounter: Admitting: Internal Medicine
